# Patient Record
Sex: Female | Born: 1940
Health system: Southern US, Community
[De-identification: ages and names within clinical notes are randomized; demographics above are authoritative.]

## PROBLEM LIST (undated history)

## (undated) DIAGNOSIS — K219 Gastro-esophageal reflux disease without esophagitis: Secondary | ICD-10-CM

## (undated) DIAGNOSIS — T8859XA Other complications of anesthesia, initial encounter: Secondary | ICD-10-CM

## (undated) DIAGNOSIS — E785 Hyperlipidemia, unspecified: Secondary | ICD-10-CM

## (undated) DIAGNOSIS — H269 Unspecified cataract: Secondary | ICD-10-CM

## (undated) DIAGNOSIS — D72819 Decreased white blood cell count, unspecified: Secondary | ICD-10-CM

## (undated) DIAGNOSIS — M48061 Spinal stenosis, lumbar region without neurogenic claudication: Secondary | ICD-10-CM

## (undated) DIAGNOSIS — I10 Essential (primary) hypertension: Secondary | ICD-10-CM

## (undated) DIAGNOSIS — M1990 Unspecified osteoarthritis, unspecified site: Secondary | ICD-10-CM

## (undated) DIAGNOSIS — I341 Nonrheumatic mitral (valve) prolapse: Secondary | ICD-10-CM

## (undated) DIAGNOSIS — J40 Bronchitis, not specified as acute or chronic: Secondary | ICD-10-CM

## (undated) HISTORY — DX: Spinal stenosis, lumbar region without neurogenic claudication: M48.061

## (undated) HISTORY — DX: Unspecified osteoarthritis, unspecified site: M19.90

## (undated) HISTORY — PX: CATARACT EXTRACTION, BILATERAL: SHX1313

## (undated) HISTORY — DX: Hyperlipidemia, unspecified: E78.5

## (undated) HISTORY — PX: SHOULDER ARTHROSCOPY: SHX128

## (undated) HISTORY — DX: Gastro-esophageal reflux disease without esophagitis: K21.9

## (undated) HISTORY — PX: COLONOSCOPY: SHX174

## (undated) HISTORY — PX: BLEPHAROPLASTY: SUR158

## (undated) HISTORY — PX: SHOULDER ARTHROTOMY: SHX1050

## (undated) HISTORY — DX: Unspecified cataract: H26.9

---

## 2016-03-16 DIAGNOSIS — B029 Zoster without complications: Secondary | ICD-10-CM | POA: Diagnosis not present

## 2016-06-12 DIAGNOSIS — I10 Essential (primary) hypertension: Secondary | ICD-10-CM | POA: Diagnosis not present

## 2016-06-12 DIAGNOSIS — R3129 Other microscopic hematuria: Secondary | ICD-10-CM | POA: Diagnosis not present

## 2016-06-12 DIAGNOSIS — E119 Type 2 diabetes mellitus without complications: Secondary | ICD-10-CM | POA: Diagnosis not present

## 2016-06-12 DIAGNOSIS — R634 Abnormal weight loss: Secondary | ICD-10-CM | POA: Diagnosis not present

## 2016-06-12 DIAGNOSIS — E782 Mixed hyperlipidemia: Secondary | ICD-10-CM | POA: Diagnosis not present

## 2016-06-12 DIAGNOSIS — J302 Other seasonal allergic rhinitis: Secondary | ICD-10-CM | POA: Diagnosis not present

## 2016-06-12 DIAGNOSIS — E0590 Thyrotoxicosis, unspecified without thyrotoxic crisis or storm: Secondary | ICD-10-CM | POA: Diagnosis not present

## 2016-06-12 DIAGNOSIS — Z0001 Encounter for general adult medical examination with abnormal findings: Secondary | ICD-10-CM | POA: Diagnosis not present

## 2016-06-12 DIAGNOSIS — E559 Vitamin D deficiency, unspecified: Secondary | ICD-10-CM | POA: Diagnosis not present

## 2016-06-12 DIAGNOSIS — R5383 Other fatigue: Secondary | ICD-10-CM | POA: Diagnosis not present

## 2016-06-15 DIAGNOSIS — B351 Tinea unguium: Secondary | ICD-10-CM | POA: Diagnosis not present

## 2016-06-15 DIAGNOSIS — M79675 Pain in left toe(s): Secondary | ICD-10-CM | POA: Diagnosis not present

## 2016-06-15 DIAGNOSIS — M79674 Pain in right toe(s): Secondary | ICD-10-CM | POA: Diagnosis not present

## 2016-06-15 DIAGNOSIS — I872 Venous insufficiency (chronic) (peripheral): Secondary | ICD-10-CM | POA: Diagnosis not present

## 2016-06-29 DIAGNOSIS — I10 Essential (primary) hypertension: Secondary | ICD-10-CM | POA: Diagnosis not present

## 2016-07-03 DIAGNOSIS — J302 Other seasonal allergic rhinitis: Secondary | ICD-10-CM | POA: Diagnosis not present

## 2016-07-03 DIAGNOSIS — I10 Essential (primary) hypertension: Secondary | ICD-10-CM | POA: Diagnosis not present

## 2016-09-03 DIAGNOSIS — I10 Essential (primary) hypertension: Secondary | ICD-10-CM | POA: Diagnosis not present

## 2016-10-09 DIAGNOSIS — D72819 Decreased white blood cell count, unspecified: Secondary | ICD-10-CM | POA: Diagnosis not present

## 2016-10-09 DIAGNOSIS — M129 Arthropathy, unspecified: Secondary | ICD-10-CM | POA: Diagnosis not present

## 2016-10-09 DIAGNOSIS — I10 Essential (primary) hypertension: Secondary | ICD-10-CM | POA: Diagnosis not present

## 2016-11-13 DIAGNOSIS — M4647 Discitis, unspecified, lumbosacral region: Secondary | ICD-10-CM | POA: Diagnosis not present

## 2016-11-13 DIAGNOSIS — M129 Arthropathy, unspecified: Secondary | ICD-10-CM | POA: Diagnosis not present

## 2016-11-13 DIAGNOSIS — I10 Essential (primary) hypertension: Secondary | ICD-10-CM | POA: Diagnosis not present

## 2016-11-25 ENCOUNTER — Ambulatory Visit (HOSPITAL_COMMUNITY)
Admission: EM | Admit: 2016-11-25 | Discharge: 2016-11-25 | Disposition: A | Discharge status: Home / Self Care | Payer: Self-pay | Attending: Family Medicine | Admitting: Family Medicine

## 2016-11-25 ENCOUNTER — Encounter (HOSPITAL_COMMUNITY): Payer: Self-pay | Admitting: Emergency Medicine

## 2016-11-25 DIAGNOSIS — S40021S Contusion of right upper arm, sequela: Secondary | ICD-10-CM

## 2016-11-25 DIAGNOSIS — M7711 Lateral epicondylitis, right elbow: Secondary | ICD-10-CM

## 2016-11-25 DIAGNOSIS — S40021A Contusion of right upper arm, initial encounter: Secondary | ICD-10-CM

## 2016-11-25 DIAGNOSIS — M7918 Myalgia, other site: Secondary | ICD-10-CM

## 2016-11-25 HISTORY — DX: Essential (primary) hypertension: I10

## 2016-11-25 NOTE — Discharge Instructions (Signed)
At this point  may want to try a little heat off and on.For the right elbow use ice. Ibuprofen as needed. Limit wrist extension as demonstrated.
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
## 2016-12-17 ENCOUNTER — Telehealth: Payer: Self-pay | Admitting: Family Medicine

## 2016-12-17 NOTE — Telephone Encounter (Signed)
Patient called and left message on voice mail stating that she would like to schedule New Patient  appt with our office and since Dr. Moshe Cipro is not taking New Patient's she would schedule with Dr. Meda Coffee.  Please note this is a relative the [place].  Would Dr. Moshe Cipro consider taking patient on?  Please advise

## 2016-12-18 DIAGNOSIS — M4647 Discitis, unspecified, lumbosacral region: Secondary | ICD-10-CM | POA: Diagnosis not present

## 2016-12-18 DIAGNOSIS — I10 Essential (primary) hypertension: Secondary | ICD-10-CM | POA: Diagnosis not present

## 2016-12-18 DIAGNOSIS — J302 Other seasonal allergic rhinitis: Secondary | ICD-10-CM | POA: Diagnosis not present

## 2016-12-21 NOTE — Telephone Encounter (Signed)
I called patient to schedule an appointment, no answer I left a message.

## 2016-12-21 NOTE — Telephone Encounter (Signed)
Please schedule her with Dr Meda Coffee as I am currently closed

## 2017-01-12 DIAGNOSIS — M129 Arthropathy, unspecified: Secondary | ICD-10-CM | POA: Diagnosis not present

## 2017-01-12 DIAGNOSIS — I10 Essential (primary) hypertension: Secondary | ICD-10-CM | POA: Diagnosis not present

## 2017-01-12 DIAGNOSIS — D72819 Decreased white blood cell count, unspecified: Secondary | ICD-10-CM | POA: Diagnosis not present

## 2017-01-12 DIAGNOSIS — Z23 Encounter for immunization: Secondary | ICD-10-CM | POA: Diagnosis not present

## 2017-01-18 ENCOUNTER — Encounter: Payer: Self-pay | Admitting: Family Medicine

## 2017-01-18 ENCOUNTER — Ambulatory Visit (INDEPENDENT_AMBULATORY_CARE_PROVIDER_SITE_OTHER): Payer: Medicare Other | Admitting: Family Medicine

## 2017-01-18 ENCOUNTER — Other Ambulatory Visit: Payer: Self-pay

## 2017-01-18 VITALS — BP 128/76 | HR 56 | Temp 98.0°F | Resp 16 | Ht <= 58 in | Wt 137.0 lb

## 2017-01-18 DIAGNOSIS — K219 Gastro-esophageal reflux disease without esophagitis: Secondary | ICD-10-CM | POA: Diagnosis not present

## 2017-01-18 DIAGNOSIS — Z8619 Personal history of other infectious and parasitic diseases: Secondary | ICD-10-CM | POA: Diagnosis not present

## 2017-01-18 DIAGNOSIS — D72818 Other decreased white blood cell count: Secondary | ICD-10-CM

## 2017-01-18 DIAGNOSIS — Z9109 Other allergy status, other than to drugs and biological substances: Secondary | ICD-10-CM

## 2017-01-18 DIAGNOSIS — Z1239 Encounter for other screening for malignant neoplasm of breast: Secondary | ICD-10-CM

## 2017-01-18 DIAGNOSIS — Z1231 Encounter for screening mammogram for malignant neoplasm of breast: Secondary | ICD-10-CM

## 2017-01-18 DIAGNOSIS — E559 Vitamin D deficiency, unspecified: Secondary | ICD-10-CM

## 2017-01-18 DIAGNOSIS — D72819 Decreased white blood cell count, unspecified: Secondary | ICD-10-CM | POA: Diagnosis not present

## 2017-01-18 DIAGNOSIS — M8588 Other specified disorders of bone density and structure, other site: Secondary | ICD-10-CM

## 2017-01-18 DIAGNOSIS — I10 Essential (primary) hypertension: Secondary | ICD-10-CM | POA: Diagnosis not present

## 2017-01-18 DIAGNOSIS — E785 Hyperlipidemia, unspecified: Secondary | ICD-10-CM | POA: Insufficient documentation

## 2017-01-18 NOTE — Patient Instructions (Signed)
Need old records from prior medical offices  Exercise every day that you are able  Labs ordered today I will send you a letter with your test results.  If there is anything of concern, we will call right away.  Mammogram ordered today  See me in a month for a PE

## 2017-01-18 NOTE — Progress Notes (Signed)
Chief Complaint  Patient presents with  . Hypertension   Patient is here for her initial visit.  She moved to [place] a year ago.  Previously lived in [place] over 50 years. She is a retired Scientist, research (life sciences).  She considers herself in good health.  She eats well.  She exercises.  She denies any acute symptoms. She has long-standing hypertension.  Is well controlled on hydrochlorothiazide. Has a history of one abnormal EKG.  There is a question of an old MI.  She had a cardiology workup.  They told her her heart was "fine". She has a history of GERD.  She was H. pylori positive.  She was treated with Dexilant and antibiotics.  She is currently asymptomatic. She has a history of leukopenia.  She was sent to an oncologist.  They followed her over time.  They did 2 bone marrow biopsies.  They told her that they do not feel she has a myelodysplastic disorder. She has environmental allergies.  She takes over-the-counter medications. Has a history of osteopenia.  She prefers to treat this with dietary calcium and vitamin D.  She is not on a vitamin D supplement. She states her colonoscopy was in 2017. Mammogram is overdue. She no longer needs Pap smears. She states that she had a flu shot this year, has had a pneumonia vaccine.  She had her shingles shot.  Uncertain tetanus status.  Patient Active Problem List   Diagnosis Date Noted  . Essential hypertension 01/18/2017  . Chronic GERD 01/18/2017  . History of Helicobacter pylori infection 01/18/2017  . Mild hyperlipidemia 01/18/2017  . Chronic leukopenia 01/18/2017  . Environmental allergies 01/18/2017    Outpatient Encounter Medications as of 01/18/2017  Medication Sig  . hydrochlorothiazide (HYDRODIURIL) 25 MG tablet Take 25 mg by mouth daily.   No facility-administered encounter medications on file as of 01/18/2017.     Past Medical History:  Diagnosis Date  . Arthritis    hands, neck lower back  . Cataract    extracted  . GERD (gastroesophageal reflux disease)    prior med  . Hypertension   . Mild hyperlipidemia 01/18/2017    Past Surgical History:  Procedure Laterality Date  . CATARACT EXTRACTION, BILATERAL    . SHOULDER ARTHROSCOPY    . SHOULDER ARTHROTOMY Left    about 2007    Social History   Socioeconomic History  . Marital status: Married    Spouse name: Gwyndolyn Saxon  . Number of children: 3  . Years of education: Not on file  . Highest education level: Associate degree: academic program  Social Needs  . Financial resource strain: Not hard at all  . Food insecurity - worry: Never true  . Food insecurity - inability: Never true  . Transportation needs - medical: No  . Transportation needs - non-medical: No  Occupational History  . 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 Smoking status: Never Smoker  . Smokeless tobacco: Never Used  Substance and Sexual Activity  . Alcohol use: No  . Drug use: No  . Sexual activity: Not Currently  Other Topics Concern  . Not on file  Social History Narrative   Retired Therapist, sports   Lives with Husband Gwyndolyn Saxon   moved to [place] to be near children   Daughter is Esmond Harps    Family History  Problem Relation Age of Onset  . COPD Mother   . Arthritis Mother   .  Hearing loss Mother   . Hypertension Mother   . Stroke Father 55  . Diabetes Father   . Thyroid disease Daughter   . Drug abuse Son   . Cancer Brother        prostate  . Mental illness Brother        PTSD from war  . Heart disease Sister   . Hyperlipidemia Sister   . Hypertension Sister   . Cancer Sister        Leukemia  . Cancer Maternal Aunt        breast    Review of Systems  Constitutional: Negative for chills, fever and weight loss.  HENT: Negative for congestion and hearing loss.   Eyes: Negative for blurred vision and pain.  Respiratory: Negative for cough and shortness of breath.   Cardiovascular: Negative for chest pain and leg swelling.    Gastrointestinal: Negative for abdominal pain, constipation, diarrhea and heartburn.       No heartburn  Genitourinary: Negative for dysuria and frequency.  Musculoskeletal: Positive for back pain. Negative for falls, joint pain and myalgias.       Occasional back pain  Neurological: Negative for dizziness, seizures and headaches.  Psychiatric/Behavioral: Negative for depression. The patient is not nervous/anxious and does not have insomnia.     BP 128/76 (BP Location: Left Arm, Patient Position: Sitting, Cuff Size: Normal)   Pulse (!) 56   Temp 98 F (36.7 C) (Temporal)   Resp 16   Ht 4' 9.75" (1.467 m)   Wt 137 lb 0.6 oz (62.2 kg)   SpO2 97%   BMI 28.89 kg/m   Physical Exam  Constitutional: She is oriented to person, place, and time. She appears well-developed and well-nourished.  Pleasant and articulate.  HENT:  Head: Normocephalic and atraumatic.  Mouth/Throat: Oropharynx is clear and moist.  Eyes: Conjunctivae are normal. Pupils are equal, round, and reactive to light.  Neck: Normal range of motion.  Cardiovascular: Normal rate, regular rhythm and normal heart sounds.  Pulmonary/Chest: Effort normal and breath sounds normal.  Musculoskeletal: Normal range of motion. She exhibits no edema.  Neurological: She is alert and oriented to person, place, and time.  Psychiatric: She has a normal mood and affect. Her behavior is normal. Thought content normal.   ASSESSMENT/PLAN:  1. Essential hypertension Controlled - CBC - COMPLETE METABOLIC PANEL WITH GFR - Lipid panel - Urinalysis, Routine w reflex microscopic  2. Osteopenia of other site By history - VITAMIN D 25 Hydroxy (Vit-D Deficiency, Fractures)  3. Chronic GERD Currently asymptomatic  4. Screening for breast cancer  Due for mammogram.  Past mammograms negative - MM Digital Screening; Future  5. Vitamin D deficiency By history - VITAMIN D 25 Hydroxy (Vit-D Deficiency, Fractures)  6. History of  Helicobacter pylori infection Remote history.  Currently asymptomatic. Mild hyperlipidemia Is on omega oil  7. Chronic leukopenia By history  8. Environmental allergies By history   Patient Instructions  Need old records from prior medical offices  Exercise every day that you are able  Labs ordered today I will send you a letter with your test results.  If there is anything of concern, we will call right away.  Mammogram ordered today  See me in a month for a PE   Raylene Everts, MD

## 2017-01-19 LAB — CBC
HCT: 37.8 % (ref 35.0–45.0)
Hemoglobin: 12.9 g/dL (ref 11.7–15.5)
MCH: 29.5 pg (ref 27.0–33.0)
MCHC: 34.1 g/dL (ref 32.0–36.0)
MCV: 86.5 fL (ref 80.0–100.0)
MPV: 11 fL (ref 7.5–12.5)
PLATELETS: 166 10*3/uL (ref 140–400)
RBC: 4.37 10*6/uL (ref 3.80–5.10)
RDW: 12.2 % (ref 11.0–15.0)
WBC: 2.6 10*3/uL — AB (ref 3.8–10.8)

## 2017-01-19 LAB — LIPID PANEL
CHOL/HDL RATIO: 1.5 (calc) (ref <5.0)
CHOLESTEROL: 186 mg/dL (ref <200)
HDL: 128 mg/dL (ref >50)
LDL CHOLESTEROL (CALC): 44 mg/dL
NON-HDL CHOLESTEROL (CALC): 58 mg/dL (ref <130)
TRIGLYCERIDES: 49 mg/dL (ref <150)

## 2017-01-19 LAB — COMPLETE METABOLIC PANEL WITH GFR
AG Ratio: 1.5 (calc) (ref 1.0–2.5)
ALKALINE PHOSPHATASE (APISO): 37 U/L (ref 33–130)
ALT: 20 U/L (ref 6–29)
AST: 24 U/L (ref 10–35)
Albumin: 4.3 g/dL (ref 3.6–5.1)
BUN: 17 mg/dL (ref 7–25)
CALCIUM: 9.9 mg/dL (ref 8.6–10.4)
CO2: 31 mmol/L (ref 20–32)
CREATININE: 0.88 mg/dL (ref 0.60–0.93)
Chloride: 102 mmol/L (ref 98–110)
GFR, EST NON AFRICAN AMERICAN: 64 mL/min/{1.73_m2} (ref >=60)
GFR, Est African American: 74 mL/min/{1.73_m2} (ref >=60)
Globulin: 2.8 g/dL (calc) (ref 1.9–3.7)
Glucose, Bld: 84 mg/dL (ref 65–99)
Potassium: 4 mmol/L (ref 3.5–5.3)
Sodium: 140 mmol/L (ref 135–146)
Total Bilirubin: 1.3 mg/dL — ABNORMAL HIGH (ref 0.2–1.2)
Total Protein: 7.1 g/dL (ref 6.1–8.1)

## 2017-01-19 LAB — URINALYSIS, ROUTINE W REFLEX MICROSCOPIC
BILIRUBIN URINE: NEGATIVE
Glucose, UA: NEGATIVE
Hgb urine dipstick: NEGATIVE
KETONES UR: NEGATIVE
Leukocytes, UA: NEGATIVE
NITRITE: NEGATIVE
PROTEIN: NEGATIVE
Specific Gravity, Urine: 1.01 (ref 1.001–1.03)
pH: 6.5 (ref 5.0–8.0)

## 2017-01-19 LAB — VITAMIN D 25 HYDROXY (VIT D DEFICIENCY, FRACTURES): VIT D 25 HYDROXY: 35 ng/mL (ref 30–100)

## 2017-01-26 ENCOUNTER — Encounter: Payer: Self-pay | Admitting: Family Medicine

## 2017-02-04 DIAGNOSIS — J302 Other seasonal allergic rhinitis: Secondary | ICD-10-CM | POA: Diagnosis not present

## 2017-02-04 DIAGNOSIS — M129 Arthropathy, unspecified: Secondary | ICD-10-CM | POA: Diagnosis not present

## 2017-02-04 DIAGNOSIS — I10 Essential (primary) hypertension: Secondary | ICD-10-CM | POA: Diagnosis not present

## 2017-02-17 ENCOUNTER — Other Ambulatory Visit: Payer: Self-pay | Admitting: Family Medicine

## 2017-02-17 DIAGNOSIS — Z1231 Encounter for screening mammogram for malignant neoplasm of breast: Secondary | ICD-10-CM

## 2017-02-18 ENCOUNTER — Encounter: Payer: Self-pay | Admitting: Family Medicine

## 2017-02-18 ENCOUNTER — Other Ambulatory Visit: Payer: Self-pay

## 2017-02-18 ENCOUNTER — Ambulatory Visit (INDEPENDENT_AMBULATORY_CARE_PROVIDER_SITE_OTHER): Payer: Medicare Other | Admitting: Family Medicine

## 2017-02-18 VITALS — BP 120/74 | HR 60 | Temp 97.8°F | Resp 16 | Ht <= 58 in | Wt 143.1 lb

## 2017-02-18 DIAGNOSIS — I10 Essential (primary) hypertension: Secondary | ICD-10-CM | POA: Diagnosis not present

## 2017-02-18 DIAGNOSIS — H9193 Unspecified hearing loss, bilateral: Secondary | ICD-10-CM

## 2017-02-18 DIAGNOSIS — Z0000 Encounter for general adult medical examination without abnormal findings: Secondary | ICD-10-CM | POA: Diagnosis not present

## 2017-02-18 DIAGNOSIS — Z8679 Personal history of other diseases of the circulatory system: Secondary | ICD-10-CM | POA: Diagnosis not present

## 2017-02-18 DIAGNOSIS — D72819 Decreased white blood cell count, unspecified: Secondary | ICD-10-CM | POA: Diagnosis not present

## 2017-02-18 NOTE — Progress Notes (Signed)
Chief Complaint  Patient presents with  . Annual Exam  Patient presents today for physical examination She is in good health and compliant with her blood pressure medication.  Her blood pressure is well controlled She requests a referral to ENT for her hearing loss, (upon suggestion from her husband). She previously was under the care of him at otology for her leukopenia.  This is stable on recent lab testing.  She does not feel she needs to go to hematology at this time We did discuss her recent laboratory results.  She has mild hyperlipidemia.  She had mild leukopenia.  Everything else was normal as expected. Her mammogram is scheduled for later this month. Her DEXA scan was in 2017. Her colonoscopy was in 2017. I do not have records but her immunizations are up-to-date including tetanus, shingles, pneumonia, and flu.  These records are requested she tries to eat well.  Her weight is stable.  She does not get regular exercise.  This is recommended for her. She states this time year she does have a fair amount of allergy symptoms, postnasal drip, and hoarseness.  She chooses not to take medication.  She understands that over-the-counter medicine such as Claritin or Zyrtec, Flonase or Nasonex may help with her symptoms if she chooses She is overdue for an eye exam and the suggestion She does not require dental evaluation since she has dentures that fit well   Patient Active Problem List   Diagnosis Date Noted  . H/O mitral valve prolapse 02/18/2017  . Essential hypertension 01/18/2017  . Chronic GERD 01/18/2017  . History of Helicobacter pylori infection 01/18/2017  . Mild hyperlipidemia 01/18/2017  . Chronic leukopenia 01/18/2017  . Environmental allergies 01/18/2017    Outpatient Encounter Medications as of 02/18/2017  Medication Sig  . hydrochlorothiazide (HYDRODIURIL) 25 MG tablet Take 25 mg by mouth daily.   No facility-administered encounter medications on file as of  02/18/2017.     No Known Allergies  Review of Systems  Constitutional: Negative for activity change, appetite change and unexpected weight change.  HENT: Positive for hearing loss, postnasal drip and voice change. Negative for congestion, dental problem and rhinorrhea.   Eyes: Negative for redness and visual disturbance.  Respiratory: Negative for cough and shortness of breath.   Cardiovascular: Negative for chest pain, palpitations and leg swelling.  Gastrointestinal: Negative for abdominal pain, constipation and diarrhea.  Genitourinary: Negative for difficulty urinating, frequency, menstrual problem and vaginal bleeding.  Musculoskeletal: Negative for arthralgias and back pain.  Neurological: Negative for dizziness and headaches.  Psychiatric/Behavioral: Negative for confusion, dysphoric mood and sleep disturbance. The patient is not nervous/anxious.     BP 120/74 (BP Location: Left Arm, Patient Position: Sitting, Cuff Size: Normal)   Pulse 60   Temp 97.8 F (36.6 C) (Temporal)   Resp 16   Ht 4\' 10"  (1.473 m)   Wt 143 lb 1.3 oz (64.9 kg)   SpO2 99%   BMI 29.90 kg/m   Physical Exam  BP 120/74 (BP Location: Left Arm, Patient Position: Sitting, Cuff Size: Normal)   Pulse 60   Temp 97.8 F (36.6 C) (Temporal)   Resp 16   Ht 4\' 10"  (1.473 m)   Wt 143 lb 1.3 oz (64.9 kg)   SpO2 99%   BMI 29.90 kg/m   General Appearance:    Alert, cooperative, no distress, appears stated age  Head:    Normocephalic, without obvious abnormality, atraumatic  Eyes:    PERRL, conjunctiva/corneas  clear, EOM's intact, fundi    benign, both eyes.  Status post bilateral cataract surgery with mild cloudiness  Ears:    Normal TM's and external ear canals, both ears  Nose:   Nares normal, septum midline, mucosa normal, no drainage    or sinus tenderness  Throat:   Lips, mucosa, and tongue normal,  and gums normal  Neck:   Supple, symmetrical, trachea midline, no adenopathy;    thyroid:  no  enlargement/tenderness/nodules; no carotid   bruit   Back:     Symmetric, no curvature, ROM normal, no CVA tenderness  Lungs:     Clear to auscultation bilaterally, respirations unlabored  Chest Wall:    No tenderness or deformity   Heart:    Regular rate and rhythm, S1 and S2 normal, no murmur, rub   or gallop  Breast    d eferred per patient  Abdomen:     Soft, non-tender, bowel sounds active all four quadrants,    no masses, no organomegaly  Extremities:   Extremities normal, atraumatic, no cyanosis or edema  Pulses:   2+ and symmetric all extremities  Skin:   Skin color, texture, turgor normal, no rashes or lesions  Lymph nodes:   Cervical, supraclavicular, and axillary nodes normal  Neurologic:   Normal strength, sensation and brisk reflexes    throughout     ASSESSMENT/PLAN:  1. Annual physical exam No unexpected findings  2. H/O mitral valve prolapse No murmur auscultated  3. Bilateral hearing loss, unspecified hearing loss type Referred for hearing evaluation - Ambulatory referral to ENT 4.  Chronic leukopenia stable  5 essential hypertension well-controlled  Patient Instructions  Call for illness or if you desire referrals (hematology or cardiology) Due for Eye exam Continue to eat well and try to get more exercise  See me yearly for physical   Raylene Everts, MD

## 2017-02-18 NOTE — Patient Instructions (Signed)
Call for illness or if you desire referrals (hematology or cardiology) Due for Eye exam Continue to eat well and try to get more exercise  See me yearly for physical

## 2017-02-22 ENCOUNTER — Ambulatory Visit (HOSPITAL_COMMUNITY)
Admission: RE | Admit: 2017-02-22 | Discharge: 2017-02-22 | Disposition: A | Discharge status: Home / Self Care | Payer: Medicare Other | Source: Ambulatory Visit | Attending: Family Medicine | Admitting: Family Medicine

## 2017-02-22 DIAGNOSIS — Z1231 Encounter for screening mammogram for malignant neoplasm of breast: Secondary | ICD-10-CM | POA: Diagnosis not present

## 2017-02-24 ENCOUNTER — Other Ambulatory Visit: Payer: Self-pay | Admitting: Family Medicine

## 2017-02-24 DIAGNOSIS — R928 Other abnormal and inconclusive findings on diagnostic imaging of breast: Secondary | ICD-10-CM

## 2017-03-08 ENCOUNTER — Telehealth: Payer: Self-pay

## 2017-03-08 DIAGNOSIS — R928 Other abnormal and inconclusive findings on diagnostic imaging of breast: Secondary | ICD-10-CM

## 2017-03-08 NOTE — Telephone Encounter (Signed)
mammo and [place] orders pended.

## 2017-03-16 ENCOUNTER — Ambulatory Visit (HOSPITAL_COMMUNITY)
Admission: RE | Admit: 2017-03-16 | Discharge: 2017-03-16 | Disposition: A | Discharge status: Home / Self Care | Payer: Medicare Other | Source: Ambulatory Visit | Attending: Family Medicine | Admitting: Family Medicine

## 2017-03-16 DIAGNOSIS — R922 Inconclusive mammogram: Secondary | ICD-10-CM | POA: Diagnosis not present

## 2017-03-16 DIAGNOSIS — R928 Other abnormal and inconclusive findings on diagnostic imaging of breast: Secondary | ICD-10-CM | POA: Insufficient documentation

## 2017-03-16 DIAGNOSIS — N6489 Other specified disorders of breast: Secondary | ICD-10-CM | POA: Diagnosis not present

## 2017-04-22 ENCOUNTER — Ambulatory Visit (INDEPENDENT_AMBULATORY_CARE_PROVIDER_SITE_OTHER): Payer: Medicare Other | Admitting: Otolaryngology

## 2017-04-22 DIAGNOSIS — H903 Sensorineural hearing loss, bilateral: Secondary | ICD-10-CM | POA: Diagnosis not present

## 2017-05-03 ENCOUNTER — Encounter: Payer: Self-pay | Admitting: Family Medicine

## 2017-05-04 ENCOUNTER — Encounter: Payer: Self-pay | Admitting: Family Medicine

## 2017-05-04 DIAGNOSIS — R922 Inconclusive mammogram: Secondary | ICD-10-CM | POA: Insufficient documentation

## 2017-05-22 LAB — POCT GLYCOSYLATED HEMOGLOBIN (HGB A1C)

## 2017-06-23 DIAGNOSIS — H52203 Unspecified astigmatism, bilateral: Secondary | ICD-10-CM | POA: Diagnosis not present

## 2017-06-23 DIAGNOSIS — Z961 Presence of intraocular lens: Secondary | ICD-10-CM | POA: Diagnosis not present

## 2017-10-05 ENCOUNTER — Telehealth: Payer: Self-pay | Admitting: Family Medicine

## 2017-10-05 NOTE — Telephone Encounter (Signed)
Mrs Esmond Harps, is calling to see if you will see her Mother Wendy [name], as she was a former Designer, industrial/product patient.

## 2017-10-19 NOTE — Telephone Encounter (Signed)
Please explain to   Caller  that  I am still  unable to accept new patients at this time

## 2017-10-20 NOTE — Telephone Encounter (Signed)
I called patient and LMOM also called Esmond Harps to let her know we could not accept any new patients at this time.  I told her we are hoping to open a new clinic downstairs in spring 2020.

## 2017-11-15 ENCOUNTER — Ambulatory Visit (HOSPITAL_COMMUNITY)
Admission: EM | Admit: 2017-11-15 | Discharge: 2017-11-15 | Disposition: A | Discharge status: Home / Self Care | Payer: Medicare Other | Attending: Family Medicine | Admitting: Family Medicine

## 2017-11-15 ENCOUNTER — Encounter (HOSPITAL_COMMUNITY): Payer: Self-pay | Admitting: Family Medicine

## 2017-11-15 DIAGNOSIS — B9789 Other viral agents as the cause of diseases classified elsewhere: Secondary | ICD-10-CM

## 2017-11-15 DIAGNOSIS — H60542 Acute eczematoid otitis externa, left ear: Secondary | ICD-10-CM

## 2017-11-15 DIAGNOSIS — J069 Acute upper respiratory infection, unspecified: Secondary | ICD-10-CM | POA: Diagnosis not present

## 2017-11-15 MED ORDER — NEOMYCIN-POLYMYXIN-HC 3.5-10000-1 OT SUSP: 4.0000 [drp] | Freq: Every day | OTIC | 1 refills | Status: DC | PRN

## 2017-11-15 NOTE — ED Provider Notes (Signed)
[facility]    CSN: [id number] Arrival date & time: 11/15/17  1430     History   Chief Complaint Chief Complaint  Patient presents with  . URI    HPI Wendy [name] is a 77 y.o. female.   77 year old woman comes in for evaluation of upper respiratory type symptoms.  Her past medical history is significant for hypertension, past history of H. pylori, and chronic leukopenia.  Patient has had a cough, sore throat and some congestion for the last 3 or 4 days.  She has had no fever.  The cough is productive occasionally with clear phlegm.  She has had some left chest pain which she was wanting to make sure was not a walking pneumonia.  Patient has a separate problem which is itching in her left ear canal periodically.     Past Medical History:  Diagnosis Date  . Arthritis    hands, neck lower back  . Cataract    extracted  . GERD (gastroesophageal reflux disease)    prior med  . Hypertension   . Mild hyperlipidemia 01/18/2017    Patient Active Problem List   Diagnosis Date Noted  . Dense breast tissue on mammogram 05/04/2017  . H/O mitral valve prolapse 02/18/2017  . Essential hypertension 01/18/2017  . Chronic GERD 01/18/2017  . History of Helicobacter pylori infection 01/18/2017  . Mild hyperlipidemia 01/18/2017  . Chronic leukopenia 01/18/2017  . Environmental allergies 01/18/2017    Past Surgical History:  Procedure Laterality Date  . CATARACT EXTRACTION, BILATERAL    . SHOULDER ARTHROSCOPY    . SHOULDER ARTHROTOMY Left    about 2007    OB History   None      Home Medications    Prior to Admission medications   Medication Sig Start Date End Date Taking? Authorizing Provider  hydrochlorothiazide (HYDRODIURIL) 25 MG tablet Take 25 mg by mouth daily.    [provider]  neomycin-polymyxin-hydrocortisone (CORTISPORIN) 3.5-10000-1 OTIC suspension Place 4 drops into the left ear daily as needed. 11/15/17   Robyn Haber, MD     Family History Family History  Problem Relation Age of Onset  . COPD Mother   . Arthritis Mother   . Hearing loss Mother   . Hypertension Mother   . Stroke Father 19  . Diabetes Father   . Thyroid disease Daughter   . Drug abuse Son   . Cancer Brother        prostate  . Mental illness Brother        PTSD from war  . Heart disease Sister   . Hyperlipidemia Sister   . Hypertension Sister   . Cancer Sister        Leukemia  . Cancer Maternal Aunt        breast    Social History Social History   Tobacco Use  . Smoking status: Never Smoker  . Smokeless tobacco: Never Used  Substance Use Topics  . Alcohol use: No  . Drug use: No     Allergies   Patient has no known allergies.   Review of Systems Review of Systems  Constitutional: Negative.   HENT: Positive for congestion and sore throat.   Respiratory: Positive for cough.   All other systems reviewed and are negative.    Physical Exam Triage Vital Signs ED Triage Vitals [11/15/17 1515]  Enc Vitals Group     BP (!) 145/96     Pulse Rate (!) 56  Resp 18     Temp 97.9 F (36.6 C)     Temp Source Oral     SpO2 99 %     Weight      Height      Head Circumference      Peak Flow      Pain Score      Pain Loc      Pain Edu?      Excl. in [place]?    No data found.  Updated Vital Signs BP (!) 146/85 (BP Location: Right Arm)   Pulse 82   Temp 98.5 F (36.9 C) (Oral)   Resp 16   SpO2 100%   Physical Exam  Constitutional: She is oriented to person, place, and time. She appears well-developed and well-nourished.  HENT:  Right Ear: External ear normal.  Mouth/Throat: Oropharynx is clear and moist.  Few exudates in the left ear canal  Eyes: Pupils are equal, round, and reactive to light. Conjunctivae are normal.  Neck: Normal range of motion. Neck supple.  Cardiovascular: Normal rate, regular rhythm and normal heart sounds.  Pulmonary/Chest: Effort normal and breath sounds normal. She exhibits no  tenderness.  Musculoskeletal: Normal range of motion.  Neurological: She is alert and oriented to person, place, and time.  Skin: Skin is warm and dry.  Nursing note and vitals reviewed.    UC Treatments / Results  Labs (all labs ordered are listed, but only abnormal results are displayed) Labs Reviewed - No data to display  EKG None  Radiology No results found.  Procedures Procedures (including critical care time)  Medications Ordered in UC Medications - No data to display  Initial Impression / Assessment and Plan / UC Course  I have reviewed the triage vital signs and the nursing notes.  Pertinent labs & imaging results that were available during my care of the patient were reviewed by me and considered in my medical decision making (see chart for details).    Final Clinical Impressions(s) / UC Diagnoses   Final diagnoses:  Viral URI with cough  Eczema of left external ear   Discharge Instructions   None    ED Prescriptions    Medication Sig Dispense Auth. Provider   neomycin-polymyxin-hydrocortisone (CORTISPORIN) 3.5-10000-1 OTIC suspension Place 4 drops into the left ear daily as needed. 10 mL Robyn Haber, MD     Controlled Substance Prescriptions [place] Controlled Substance Registry consulted? Not Applicable   Robyn Haber, MD 11/15/17 1546

## 2017-11-15 NOTE — ED Triage Notes (Signed)
Provider triage  

## 2017-12-21 DIAGNOSIS — M8589 Other specified disorders of bone density and structure, multiple sites: Secondary | ICD-10-CM | POA: Diagnosis not present

## 2017-12-21 DIAGNOSIS — I341 Nonrheumatic mitral (valve) prolapse: Secondary | ICD-10-CM | POA: Diagnosis not present

## 2017-12-21 DIAGNOSIS — Z13220 Encounter for screening for lipoid disorders: Secondary | ICD-10-CM | POA: Diagnosis not present

## 2017-12-21 DIAGNOSIS — I10 Essential (primary) hypertension: Secondary | ICD-10-CM | POA: Diagnosis not present

## 2017-12-21 DIAGNOSIS — Z8719 Personal history of other diseases of the digestive system: Secondary | ICD-10-CM | POA: Diagnosis not present

## 2017-12-21 DIAGNOSIS — D72819 Decreased white blood cell count, unspecified: Secondary | ICD-10-CM | POA: Diagnosis not present

## 2017-12-21 DIAGNOSIS — Z01419 Encounter for gynecological examination (general) (routine) without abnormal findings: Secondary | ICD-10-CM | POA: Diagnosis not present

## 2017-12-22 DIAGNOSIS — Z01419 Encounter for gynecological examination (general) (routine) without abnormal findings: Secondary | ICD-10-CM | POA: Diagnosis not present

## 2017-12-22 DIAGNOSIS — I341 Nonrheumatic mitral (valve) prolapse: Secondary | ICD-10-CM | POA: Diagnosis not present

## 2017-12-22 DIAGNOSIS — M8589 Other specified disorders of bone density and structure, multiple sites: Secondary | ICD-10-CM | POA: Diagnosis not present

## 2017-12-22 DIAGNOSIS — Z8719 Personal history of other diseases of the digestive system: Secondary | ICD-10-CM | POA: Diagnosis not present

## 2017-12-22 DIAGNOSIS — Z136 Encounter for screening for cardiovascular disorders: Secondary | ICD-10-CM | POA: Diagnosis not present

## 2017-12-22 DIAGNOSIS — D72819 Decreased white blood cell count, unspecified: Secondary | ICD-10-CM | POA: Diagnosis not present

## 2017-12-22 DIAGNOSIS — M810 Age-related osteoporosis without current pathological fracture: Secondary | ICD-10-CM | POA: Diagnosis not present

## 2017-12-22 DIAGNOSIS — I10 Essential (primary) hypertension: Secondary | ICD-10-CM | POA: Diagnosis not present

## 2017-12-31 DIAGNOSIS — Z23 Encounter for immunization: Secondary | ICD-10-CM | POA: Diagnosis not present

## 2018-02-17 DIAGNOSIS — Z01419 Encounter for gynecological examination (general) (routine) without abnormal findings: Secondary | ICD-10-CM | POA: Diagnosis not present

## 2018-02-17 DIAGNOSIS — Z8739 Personal history of other diseases of the musculoskeletal system and connective tissue: Secondary | ICD-10-CM | POA: Diagnosis not present

## 2018-02-17 DIAGNOSIS — R922 Inconclusive mammogram: Secondary | ICD-10-CM | POA: Diagnosis not present

## 2018-02-18 ENCOUNTER — Other Ambulatory Visit: Payer: Self-pay | Admitting: Nurse Practitioner

## 2018-02-18 DIAGNOSIS — M8580 Other specified disorders of bone density and structure, unspecified site: Secondary | ICD-10-CM

## 2018-03-02 DIAGNOSIS — H2513 Age-related nuclear cataract, bilateral: Secondary | ICD-10-CM | POA: Diagnosis not present

## 2018-03-11 ENCOUNTER — Other Ambulatory Visit (HOSPITAL_COMMUNITY): Payer: Self-pay | Admitting: Obstetrics and Gynecology

## 2018-03-11 DIAGNOSIS — Z1231 Encounter for screening mammogram for malignant neoplasm of breast: Secondary | ICD-10-CM

## 2018-03-17 ENCOUNTER — Encounter (HOSPITAL_COMMUNITY): Payer: Self-pay

## 2018-03-17 ENCOUNTER — Ambulatory Visit (HOSPITAL_COMMUNITY)
Admission: RE | Admit: 2018-03-17 | Discharge: 2018-03-17 | Disposition: A | Discharge status: Home / Self Care | Payer: Medicare Other | Source: Ambulatory Visit | Attending: Obstetrics and Gynecology | Admitting: Obstetrics and Gynecology

## 2018-03-17 DIAGNOSIS — Z1231 Encounter for screening mammogram for malignant neoplasm of breast: Secondary | ICD-10-CM | POA: Insufficient documentation

## 2018-03-23 ENCOUNTER — Ambulatory Visit
Admission: RE | Admit: 2018-03-23 | Discharge: 2018-03-23 | Disposition: A | Discharge status: Home / Self Care | Payer: Medicare Other | Source: Ambulatory Visit | Attending: Nurse Practitioner | Admitting: Nurse Practitioner

## 2018-03-23 DIAGNOSIS — M8580 Other specified disorders of bone density and structure, unspecified site: Secondary | ICD-10-CM

## 2018-03-23 DIAGNOSIS — Z780 Asymptomatic menopausal state: Secondary | ICD-10-CM | POA: Diagnosis not present

## 2018-03-23 DIAGNOSIS — Z13820 Encounter for screening for osteoporosis: Secondary | ICD-10-CM | POA: Diagnosis not present

## 2018-04-13 DIAGNOSIS — J069 Acute upper respiratory infection, unspecified: Secondary | ICD-10-CM | POA: Diagnosis not present

## 2018-04-25 ENCOUNTER — Ambulatory Visit (INDEPENDENT_AMBULATORY_CARE_PROVIDER_SITE_OTHER): Payer: Medicare Other | Admitting: Otolaryngology

## 2018-04-26 DIAGNOSIS — M8589 Other specified disorders of bone density and structure, multiple sites: Secondary | ICD-10-CM | POA: Diagnosis not present

## 2018-04-26 DIAGNOSIS — I10 Essential (primary) hypertension: Secondary | ICD-10-CM | POA: Diagnosis not present

## 2018-04-26 DIAGNOSIS — Z8719 Personal history of other diseases of the digestive system: Secondary | ICD-10-CM | POA: Diagnosis not present

## 2018-04-26 DIAGNOSIS — I341 Nonrheumatic mitral (valve) prolapse: Secondary | ICD-10-CM | POA: Diagnosis not present

## 2018-04-26 DIAGNOSIS — D72819 Decreased white blood cell count, unspecified: Secondary | ICD-10-CM | POA: Diagnosis not present

## 2018-04-26 DIAGNOSIS — Z13220 Encounter for screening for lipoid disorders: Secondary | ICD-10-CM | POA: Diagnosis not present

## 2018-04-28 ENCOUNTER — Ambulatory Visit (INDEPENDENT_AMBULATORY_CARE_PROVIDER_SITE_OTHER): Payer: Medicare Other | Admitting: Otolaryngology

## 2018-04-28 DIAGNOSIS — H903 Sensorineural hearing loss, bilateral: Secondary | ICD-10-CM | POA: Diagnosis not present

## 2018-04-28 DIAGNOSIS — R05 Cough: Secondary | ICD-10-CM

## 2018-08-30 DIAGNOSIS — M8589 Other specified disorders of bone density and structure, multiple sites: Secondary | ICD-10-CM | POA: Diagnosis not present

## 2018-08-30 DIAGNOSIS — D72819 Decreased white blood cell count, unspecified: Secondary | ICD-10-CM | POA: Diagnosis not present

## 2018-08-30 DIAGNOSIS — I10 Essential (primary) hypertension: Secondary | ICD-10-CM | POA: Diagnosis not present

## 2018-09-06 DIAGNOSIS — D72819 Decreased white blood cell count, unspecified: Secondary | ICD-10-CM | POA: Diagnosis not present

## 2018-09-06 DIAGNOSIS — I10 Essential (primary) hypertension: Secondary | ICD-10-CM | POA: Diagnosis not present

## 2018-09-06 DIAGNOSIS — M8589 Other specified disorders of bone density and structure, multiple sites: Secondary | ICD-10-CM | POA: Diagnosis not present

## 2018-09-06 DIAGNOSIS — H02409 Unspecified ptosis of unspecified eyelid: Secondary | ICD-10-CM | POA: Diagnosis not present

## 2018-09-15 DIAGNOSIS — H02401 Unspecified ptosis of right eyelid: Secondary | ICD-10-CM | POA: Diagnosis not present

## 2018-09-15 DIAGNOSIS — M8589 Other specified disorders of bone density and structure, multiple sites: Secondary | ICD-10-CM | POA: Diagnosis not present

## 2018-09-15 DIAGNOSIS — R51 Headache: Secondary | ICD-10-CM | POA: Diagnosis not present

## 2018-09-15 DIAGNOSIS — I10 Essential (primary) hypertension: Secondary | ICD-10-CM | POA: Diagnosis not present

## 2018-09-15 DIAGNOSIS — D72819 Decreased white blood cell count, unspecified: Secondary | ICD-10-CM | POA: Diagnosis not present

## 2018-09-18 ENCOUNTER — Other Ambulatory Visit: Payer: Self-pay | Admitting: *Deleted

## 2018-09-18 DIAGNOSIS — Z20822 Contact with and (suspected) exposure to covid-19: Secondary | ICD-10-CM

## 2018-09-20 ENCOUNTER — Encounter: Payer: Self-pay | Admitting: Neurology

## 2018-09-20 ENCOUNTER — Telehealth: Payer: Self-pay | Admitting: Neurology

## 2018-09-20 ENCOUNTER — Other Ambulatory Visit: Payer: Self-pay

## 2018-09-20 ENCOUNTER — Ambulatory Visit (INDEPENDENT_AMBULATORY_CARE_PROVIDER_SITE_OTHER): Payer: Medicare Other | Admitting: Neurology

## 2018-09-20 VITALS — BP 130/68 | HR 57 | Temp 98.0°F | Ht 59.0 in | Wt 144.6 lb

## 2018-09-20 DIAGNOSIS — R51 Headache: Secondary | ICD-10-CM | POA: Diagnosis not present

## 2018-09-20 DIAGNOSIS — E0500 Thyrotoxicosis with diffuse goiter without thyrotoxic crisis or storm: Secondary | ICD-10-CM

## 2018-09-20 DIAGNOSIS — H02401 Unspecified ptosis of right eyelid: Secondary | ICD-10-CM

## 2018-09-20 DIAGNOSIS — R519 Headache, unspecified: Secondary | ICD-10-CM

## 2018-09-20 DIAGNOSIS — H5461 Unqualified visual loss, right eye, normal vision left eye: Secondary | ICD-10-CM

## 2018-09-20 DIAGNOSIS — H5789 Other specified disorders of eye and adnexa: Secondary | ICD-10-CM

## 2018-09-20 DIAGNOSIS — I671 Cerebral aneurysm, nonruptured: Secondary | ICD-10-CM | POA: Diagnosis not present

## 2018-09-20 MED ORDER — PREDNISONE 20 MG PO TABS: 60.0000 mg | ORAL_TABLET | Freq: Every day | ORAL | 0 refills | Status: DC

## 2018-09-20 NOTE — Patient Instructions (Addendum)
Start prednisone today MRI of the brain and blood vessels Labwork    Temporal Arteritis  Temporal arteritis is a condition that causes arteries to become inflamed. It usually affects arteries in your head and face, but arteries in any part of the body can become inflamed. The condition is also called giant cell arteritis.  Temporal arteritis can cause serious problems such as blindness. Early treatment can help prevent these problems. What are the causes? The cause of this condition is not known. What increases the risk? The following factors may make you more likely to develop this condition:  Being older than 50.  Being a woman.  Being Caucasian.  Being of [place], [place], [place], [place] or [place] ancestry.  Having a family history of the condition.  Having a certain condition that causes muscle pain and stiffness (polymyalgia rheumatica, PMR). What are the signs or symptoms? Some people with temporal arteritis have just one symptom, while others have several symptoms. Most symptoms are related to the head and face. These may include:  Headache.  Hard or swollen temples. This is common. Your temples are the areas on either side of your forehead. If your temples are swollen, it may hurt to touch them.  Pain when combing your hair or when laying your head down.  Pain in the jaw when chewing.  Pain in the throat or tongue.  Problems with your vision, such as sudden loss of vision in one eye, or seeing double. Other symptoms may include:  Fever.  Tiredness (fatigue).  A dry cough.  Pain in the hips and shoulders.  Pain in the arms during exercise.  Depression.  Weight loss. How is this diagnosed? This condition may be diagnosed based on:  Your symptoms.  Your medical history.  A physical exam.  Tests, including: ? Blood tests. ? A test in which a tissue sample is removed from an artery so it can be examined (biopsy). ? Imaging tests, such as an  ultrasound or MRI. How is this treated? This condition may be treated with:  A type of medicine to reduce inflammation (corticosteroid).  Medicines to weaken your immune system (immunosuppressants).  Other medicines to treat vision problems. You will need to see your health care provider while you are being treated. The medicines used to treat this condition can increase your risk of problems such as bone loss and diabetes. During follow-up visits, your health care provider will check for problems by:  Doing blood tests and bone density tests.  Checking your blood pressure and blood sugar. Follow these instructions at home: Medicines  Take over-the-counter and prescription medicines only as told by your health care provider.  Take any vitamins or supplements recommended by your health care provider. These may include vitamin D and calcium, which help keep your bones from becoming weak. Eating and drinking   Eat a heart-healthy diet. This may include: ? Eating high-fiber foods, such as fresh fruits and vegetables, whole grains, and beans. ? Eating heart-healthy fats (omega-3 fats), such as fish, flaxseed, and flaxseed oil. ? Limiting foods that are high in saturated fat and cholesterol, such as processed and fried foods, fatty meat, and full-fat dairy. ? Limiting how much salt (sodium) you eat.  Include calcium and vitamin D in your diet. Good sources of calcium and vitamin D include: ? Low-fat dairy products such as milk, yogurt, and cheese. ? Certain fish, such as fresh or canned salmon, tuna, and sardines. ? Products that have calcium and vitamin D added  to them (fortified products), such as fortified cereals or juice. General instructions  Exercise. Talk with your health care provider about what exercises are okay for you. Exercises that increase your heart rate (aerobic exercise), such as walking, are often recommended. Aerobic exercise helps control your blood pressure and  prevent bone loss.  Stay up to date on all vaccines as directed by your health care provider.  Keep all follow-up visits as told by your health care provider. This is important. Contact a health care provider if:  Your symptoms get worse.  You develop signs of infection, such as fever, swelling, redness, warmth, and tenderness. Get help right away if:  You lose your vision.  Your pain does not go away, even after you take medicine.  You have chest pain.  You have trouble breathing.  One side of your face or body suddenly becomes weak or numb. These symptoms may represent a serious problem that is an emergency. Do not wait to see if the symptoms will go away. Get medical help right away. Call your local emergency services (911 in the [place] Do not drive yourself to the hospital. Summary  Temporal arteritis is a condition that causes arteries to become inflamed. It usually affects arteries in your head and face.  This condition can cause serious problems, such as blindness. Treatment can help prevent these problems.  Symptoms may include hard or tender temples, pain in your jaw when chewing, problems with your vision, or pain in your hips and shoulders.  Take over-the-counter and prescription medicines as told by your health care provider. This information is not intended to replace advice given to you by your health care provider. Make sure you discuss any questions you have with your health care provider. Document Released: 12/07/2008 Document Revised: 03/25/2017 Document Reviewed: 03/23/2017 Elsevier Patient Education  2020 [employer].  Myasthenia Gravis Myasthenia gravis (MG) is a long-term (chronic) condition that causes weakness in the muscles you can control (voluntary muscles). MG can affect any voluntary muscle. The muscles most often affected are the ones that control:  Eye movement.  Facial movements.  Swallowing. MG is a disease in which the body's  disease-fighting system (immune system) attacks its own healthy tissues (autoimmune disease). When you have MG, your immune system makes proteins (antibodies) that block the chemical (acetylcholine) that your body needs to send nerve signals to your muscles. This causes muscle weakness. What are the causes? The exact cause of MG is not known. What increases the risk? The following factors may make you more likely to develop this condition:  Having an enlarged thymus gland. The thymus gland is located under the breastbone. It makes certain cells for the immune system.  Having a family history of MG. What are the signs or symptoms? Symptoms of MG may include:  Drooping eyelids.  Double vision.  Muscle weakness that gets worse with activity and gets better after rest.  Difficulty walking.  Trouble chewing and swallowing.  Trouble making facial expressions.  Slurred speech.  Weakness of the arms, hands, and legs. Sudden, severe difficulty breathing (myasthenic crisis) may develop after having:  An infection.  A fever.  A bad reaction to a medicine. Myasthenic crisis requires emergency breathing support. Sometimes symptoms of MG go away for a while (remission) and then come back later. How is this diagnosed? This condition may be diagnosed based on:  Your symptoms and medical history.  A physical exam.  Blood tests.  Tests of your muscle strength and function.  Imaging tests, such as a CT scan or an MRI. How is this treated? The goal of treatment is to improve muscle strength. Treatment may include:  Taking medicine.  Making lifestyle changes that focus on saving your energy.  Doing physical therapy to gain strength.  Having surgery to remove the thymus gland (thymectomy). This may result in a long remission for some people.  Having a procedure to remove the acetylcholine antibodies (plasmapheresis).  Getting emergency breathing support, if you experience  myasthenic crisis. If you experience remission, you may be able to stop treatment and then resume treatment when your symptoms return. Follow these instructions at home:   Take over-the-counter and prescription medicines only as told by your health care provider.  Get plenty of rest and sleep. Take frequent breaks to rest your eyes, especially when in bright light or working on a computer.  Maintain a healthy diet and a healthy weight. Work with your health care provider or a diet and nutrition specialist (dietitian) if you need help.  Do exercises as told by your health care provider or physical therapist.  Do not use any products that contain nicotine or tobacco, such as cigarettes and e-cigarettes. If you need help quitting, ask your health care provider.  Prevent infections by: ? Washing your hands often with soap and water. If soap and water are not available, use hand sanitizer. ? Avoiding contact with other people who are sick. ? Avoiding touching your eyes, nose, and mouth. ? Cleaning surfaces in your home that are touched often using a disinfectant.  Keep all follow-up visits as told by your health care provider. This is important. Contact a health care provider if:  Your symptoms change or get worse, especially after having a fever or infection. Get help right away if:  You have trouble breathing. Summary  Myasthenia gravis (MG) is a long-term (chronic) condition that causes weakness in the muscles you can control (voluntary muscles).  A symptom of MG is muscle weakness that gets worse with activity and gets better after rest.  Sudden, severe difficulty breathing (myasthenic crisis) may develop after having an infection, a fever, or a bad reaction to a medicine.  The goal of treatment is to improve muscle strength. Treatment may include medicines, lifestyle changes, physical therapy, surgery, plasmapheresis, or emergency breathing support. This information is not  intended to replace advice given to you by your health care provider. Make sure you discuss any questions you have with your health care provider. Document Released: 05/18/2000 Document Revised: 02/22/2017 Document Reviewed: 02/22/2017 Elsevier Patient Education  [place].  Cerebral Aneurysm  A cerebral aneurysm is a bulge that occurs in the blood vessel inside the brain. An aneurysm is caused when a weakened part of the blood vessel expands. The blood vessel expands due to the constant pressure from the flow of blood through the weakened blood vessel. As the weakened aneurysm expands, the walls of the aneurysm become weaker. Aneurysms are dangerous because they can leak or burst (rupture). When a cerebral aneurysm ruptures, it causes bleeding in the brain (subarachnoid hemorrhage). The blood flow to the area of the brain supplied by the artery is also reduced. This can cause stroke, seizures, or coma. A ruptured cerebral aneurysm is a medical emergency. This can cause permanent brain damage or death. What are the causes? The exact cause of this condition is not known. What increases the risk? This condition is more likely to develop in people who:  Are older. The condition  is most common in people between the ages of 49-60.  Are female  Have a family history of aneurysm in two or more direct relatives.  Have certain conditions that are passed along from parent to child (inherited). They include: ? Autosomal dominant polycystic kidney disease. This is a condition in which small, fluid-filled sacs (cysts) develop in the kidney. ? Neurofibromatosis type 1. In this condition, flat spots develop under the skin (pigmentation) and tumors grow along nerves in the skin, brain, and other parts of the body. ? Ehlers-Danlos syndrome. This is a condition in which bad connective tissue causes loose or unstable joints and creates a very soft skin that bruises or tears easily.  Smoke.  Have  high blood pressure (hypertension).  Abuse alcohol. What are the signs or symptoms? The signs and symptoms of a cerebral aneurysm that has not leaked or ruptured can depend on its size and rate of growth. A small, unchanging aneurysm generally does not cause symptoms. A larger aneurysm that is steadily growing can increase pressure on the brain or nerves.  The increased pressure from a cerebral aneurysm that has not leaked or ruptured can cause:  A headache.  Vision problems.  Numbness or weakness in an arm or leg.  Memory problems.  Problems speaking.  Seizures. If an aneurysm leaks or ruptures, it can cause a life-threatening condition, such as stroke. Symptoms may include:  A sudden, severe headache with no known cause. The headache is often described as the worst headache ever experienced.  Stiff neck.  Nausea or vomiting, especially when combined with other symptoms, such as a headache.  Sudden weakness or numbness of the face, arm, or leg, especially on one side of the body.  Sudden trouble walking or difficulty moving the arms or legs.  Double vision.  Sudden trouble seeing in one or both eyes.  Trouble speaking or understanding speech (aphasia).  Trouble swallowing.  Dizziness.  Loss of balance or coordination.  Intolerance to light.  Sudden confusion or loss of consciousness. How is this diagnosed? This condition is diagnosed using certain tests, including:  CT scan.  Computed tomographic angiogram (CTA). This test uses a dye and a scanner to produce images of your blood vessels.  Magnetic resonance angiogram (MRA). This test uses an MRI machine to produce images of your blood vessels.  Digital subtraction angiogram (DSA). This test involves placing a flexible, thin tube (catheter) into the artery in your thigh and guiding it up to the arteries in the brain. A dye is then injected into the area and X-rays are taken to create images of your blood  vessels. How is this treated? Unruptured aneurysm Treatment is complex when an aneurysm is found and it is not causing problems. Treatment is individualized, as each case is different. Many factors must be considered, such as the size and exact location of your aneurysm, your age, your overall health, and your preferences. Small aneurysms in certain locations of the brain have a very low chance of bleeding or rupturing. These small aneurysms may not be treated.  In some cases, however, treatment may be required. Treatment depends on the size and location of the aneurysm. They may include:  Coiling. During this procedure, a catheter is inserted and advanced through a blood vessel. Once the catheter reaches the aneurysm, tiny coils are used to block blood flow into the aneurysm. This procedure is sometimes done at the same time as a DSA.  Surgical clipping. During surgery, a clip is placed  at the base of the aneurysm. The clip prevents blood from continuing to enter the aneurysm.  Flow diversion. This procedure is used to divert blood flow around the aneurysm with a stent that is placed across the opening of an aneurysm. Ruptured aneurysm Immediate emergency surgery or coiling may be needed to help prevent damage to the brain and to reduce the risk of rebleeding. The timing of treatment is an important factor in preventing complications. Successful early treatment of a ruptured aneurysm within the first 3 days of a bleed helps to prevent rebleeding and blood vessel spasm. In some cases, there may be a reason to treat 10-14 days after a rupture. Many factors are considered when making this decision, and each case is handled individually. Follow these instructions at home: If your aneurysm is not treated:  Take over-the-counter and prescription medicines only as told by your health care provider.  Follow a diet suggested by your health care provider. Certain dietary changes may be advised to address  high blood pressure (hypertension), such as choosing foods that are low in salt (sodium), saturated fat, trans fat, and cholesterol.  Stay physically active. It is recommended that you get at least 30 minutes of activity on most or all days.  Do not use any products that contain nicotine or tobacco, such as cigarettes and e-cigarettes. If you need help quitting, ask your health care provider.  Limit alcohol intake to no more than 1 drink a day for nonpregnant women and 2 drinks a day for men. One drink equals 12 oz of beer, 5 oz of wine, or 1 oz of hard liquor.  Do not use street drugs. If you need help quitting, ask your health care provider.  Keep all follow-up visits as told by your health care provider. This is important. This includes any referrals, imaging studies, and laboratory tests. Proper follow-up may prevent an aneurysm rupture or a stroke. Get help right away if:  You have a sudden, severe headache with no known cause. This may include a stiff neck.  You have sudden nausea or vomiting with a severe headache.  You have a seizure.  You have other symptoms of stroke. The acronym BEFAST is an easy way to remember the main warning signs of stroke. ? B = Balance problems. Signs include dizziness, sudden trouble walking, or loss of balance. ? E = Eye problems. This includes trouble seeing or a sudden change in vision. ? F = Face changes. This includes sudden weakness or numbness of the face, or the face or eyelid drooping to one side. ? A = Arm weakness or numbness. This happens suddenly and usually on one side of the body. ? S = Speech problems. This includes trouble speaking or trouble understanding. ? T = Time. Time to call 911 or seek emergency care. Do not wait to see if symptoms will go away. Make note of the time your symptoms started. These symptoms may represent a serious problem that is an emergency. Do not wait to see if the symptoms will go away. Get medical help right  away. Call your local emergency services (911 in the [place] Do not drive yourself to the hospital. Summary  An aneurysm is a bulge in an artery.  Aneurysms are dangerous because they can leak or burst (rupture). When a cerebral aneurysm ruptures, it causes bleeding in the brain.  Treatment depends on whether the aneurysm is ruptured. A ruptured aneurysm is a medical emergency.  Get help right away if  you have symptoms of stroke. The acronym BEFAST is an easy way to remember the main warning signs of stroke. This information is not intended to replace advice given to you by your health care provider. Make sure you discuss any questions you have with your health care provider. Document Released: 11/01/2001 Document Revised: 10/29/2017 Document Reviewed: 03/19/2016 Elsevier Patient Education  2020 [employer].

## 2018-09-20 NOTE — Telephone Encounter (Signed)
Medicare/bcbs supp order sent to GI. No auth they will reach out to the patient to schedule.  

## 2018-09-20 NOTE — Progress Notes (Signed)
[facility]    Provider:  Dr Jaynee Eagles Requesting Provider: Vernie Shanks, MD Primary Care Provider:  Vernie Shanks, MD  CC:  Eye pain  HPI:  Wendy [name] is a 78 y.o. female here as requested by Vernie Shanks, MD for right-sided headache r/o myasthenia gravis.  She has a past medical history of mitral valve prolapse, hypertension, chronic leukopenia, history of gastric ulcer, osteopenia, spinal stenosis, arthritis, headache and ptosis of right eyelid.She is here with her husband who also provides information. Started with pain in the right eye, she would have a shooting pain, blurry vision of the right eye, she saw an optometrist and her prescription had changed but more in the left. She has no symptoms in the left eye. She tried the prescription, 2-3 weeks ago symptoms worsened she couldn't see out of her right eye, when she closes the left eye she has a lot of blurriness not improved by closing one eye, no double vision. She has worsening headache, she can feel the pulse and pushing down on the temple area helps, primarily around the right eye, no weakness on chewing, no jaw pain or muscular pain. Ptosis more at night with fatigue. No weakness in the arms and legs, no proximal weakness, climbing stairs well.   Reviewed notes, labs and imaging from outside physicians, which showed:  I reviewed Dr. Jodi Mourning notes.  She was seen for right-sided headache, right lid drooping with fatigue or blinking.  She complained of headache, right eye problem is bothering her, when she tires her right eye droops, she was seen by an optician and Rx was changed to different times and was told the right eye drooping may be Bell's palsy.  On exam she did have right upper eyelid drooping otherwise neurologic and physical exam was unremarkable.  I also reviewed labs which were taken in April 26, 2018 which showed creatinine 1.03, BUN 17 otherwise unremarkable BMP, CBC with white blood cells of 3 otherwise  unremarkable,  Review of Systems: Patient complains of symptoms per HPI as well as the following symptoms: no SOB, no CP, no swallowing difficulties. Pertinent negatives and positives per HPI. All others negative.   Social History   Socioeconomic History  . Marital status: Married    Spouse name: Gwyndolyn Saxon  . Number of children: 3  . Years of education: Not on file  . Highest education level: Associate degree: academic program  Occupational History  . 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 Financial resource strain: Not hard at all  . Food insecurity    Worry: Never true    Inability: Never true  . Transportation needs    Medical: No    Non-medical: No  Tobacco Use  . Smoking status: Never Smoker  . Smokeless tobacco: Never Used  Substance and Sexual Activity  . Alcohol use: No  . Drug use: No  . Sexual activity: Not Currently  Lifestyle  . Physical activity    Days per week: Not on file    Minutes per session: Not on file  . Stress: Not on file  Relationships  . Social Herbalist on phone: Not on file    Gets together: Not on file    Attends religious service: Not on file    Active member of club or organization: Not on file    Attends meetings of clubs or organizations: Not on file    Relationship status:  Not on file  . Intimate partner violence    Fear of current or ex partner: Not on file    Emotionally abused: Not on file    Physically abused: Not on file    Forced sexual activity: Not on file  Other Topics Concern  . Not on file  Social History Narrative   Retired Therapist, sports   Lives with Husband Gwyndolyn Saxon   moved to [place] to be near children   Daughter is Esmond Harps    Family History  Problem Relation Age of Onset  . COPD Mother   . Arthritis Mother   . Hearing loss Mother   . Hypertension Mother   . Stroke Father 77  . Diabetes Father   . Thyroid disease Daughter   . Drug abuse Son   . Cancer Brother        prostate   . Mental illness Brother        PTSD from war  . Heart disease Sister   . Hyperlipidemia Sister   . Hypertension Sister   . Cancer Sister        Leukemia  . Cancer Maternal Aunt        breast    Past Medical History:  Diagnosis Date  . Arthritis    hands, neck lower back  . Cataract    extracted  . GERD (gastroesophageal reflux disease)    prior med  . Hypertension   . Mild hyperlipidemia 01/18/2017    Patient Active Problem List   Diagnosis Date Noted  . Dense breast tissue on mammogram 05/04/2017  . H/O mitral valve prolapse 02/18/2017  . Essential hypertension 01/18/2017  . Chronic GERD 01/18/2017  . History of Helicobacter pylori infection 01/18/2017  . Mild hyperlipidemia 01/18/2017  . Chronic leukopenia 01/18/2017  . Environmental allergies 01/18/2017    Past Surgical History:  Procedure Laterality Date  . CATARACT EXTRACTION, BILATERAL    . SHOULDER ARTHROSCOPY    . SHOULDER ARTHROTOMY Left    about 2007    Current Outpatient Medications  Medication Sig Dispense Refill  . aspirin EC 81 MG tablet Take 81 mg by mouth daily.    . Boswellia-Glucosamine-Vit D (GLUCOSAMINE COMPLEX PO) Take 1 tablet by mouth daily.    Marland Kitchen losartan-hydrochlorothiazide (HYZAAR) 50-12.5 MG tablet Take 1 tablet by mouth daily.    . Multiple Vitamin (MULTIVITAMIN) tablet Take 1 tablet by mouth daily.    Marland Kitchen neomycin-polymyxin-hydrocortisone (CORTISPORIN) 3.5-10000-1 OTIC suspension Place 4 drops into the left ear daily as needed. (Patient not taking: Reported on 09/20/2018) 10 mL 1  . predniSONE (DELTASONE) 20 MG tablet Take 3 tablets (60 mg total) by mouth daily. Take with food. preferably in the morning. 15 tablet 0   No current facility-administered medications for this visit.     Allergies as of 09/20/2018  . (No Known Allergies)    Vitals: BP 130/68   Pulse (!) 57   Temp 98 F (36.7 C) (Oral) Comment (Src): Husbband 's temp 97.7  Ht 4\' 11"  (1.499 m)   Wt 144 lb 9.6 oz  (65.6 kg)   BMI 29.21 kg/m  Last Weight:  Wt Readings from Last 1 Encounters:  09/20/18 144 lb 9.6 oz (65.6 kg)   Last Height:   Ht Readings from Last 1 Encounters:  09/20/18 4\' 11"  (1.499 m)     Physical exam: Exam: Gen: NAD, conversant, well nourised, obese, well groomed  CV: RRR, no MRG. No Carotid Bruits. No peripheral edema, warm, nontender Eyes: Conjunctivae clear without exudates or hemorrhage  Neuro: Detailed Neurologic Exam  Speech:    Speech is normal; fluent and spontaneous with normal comprehension.  Cognition:    The patient is oriented to person, place, and time;     recent and remote memory intact;     language fluent;     normal attention, concentration,     fund of knowledge Cranial Nerves:    The pupils are equal, round, and reactive to light.  Attempted funduscopic exam could not visualize due to small pupils.  Visual fields are full to finger confrontation. Extraocular movements are intact. Trigeminal sensation is intact and the muscles of mastication are normal. Right ptosis, fatiguable upgaze.The palate elevates in the midline. Hearing intact. Voice is normal. Shoulder shrug is normal. The tongue has normal motion without fasciculations.   Coordination:    Normal finger to nose and heel to shin. Normal rapid alternating movements.   Gait:    Heel-toe and tandem gait are normal.   Motor Observation:    No asymmetry, no atrophy, and no involuntary movements noted. Tone:    Normal muscle tone.    Posture:    Posture is normal. normal erect    Strength:    Strength is V/V in the upper and lower limbs.      Sensation: intact to LT     Reflex Exam:  DTR's:    Deep tendon reflexes in the upper and lower extremities are normal bilaterally.   Toes:    The toes are downgoing bilaterally.   Clonus:    Clonus is absent.    Assessment/Plan:  Patient with vision loss in the right eye, ptosis, headache - I am concerned about  temporal arteritis however  ddx including giant cell arteritis, myasthenia gravis (not c/w vision loss), aneurysm, other compressive lesion or mass.   Start steroids asap.   Orders Placed This Encounter  Procedures  . MR BRAIN W WO CONTRAST  . MR ANGIO HEAD WO CONTRAST  . Sedimentation rate  . C-reactive protein  . Acetylcholine receptor, binding  . Acetylcholine receptor, blocking  . Acetylcholine receptor, modulating  . Comprehensive metabolic panel  . CBC with Differential/Platelets  . TSH  . CK   Meds ordered this encounter  Medications  . predniSONE (DELTASONE) 20 MG tablet    Sig: Take 3 tablets (60 mg total) by mouth daily. Take with food. preferably in the morning.    Dispense:  15 tablet    Refill:  0    Cc: Vernie Shanks, MD,    Sarina Ill, MD  [facility] [address] [place], [place] [address]  Phone [phone] Fax [phone]

## 2018-09-21 DIAGNOSIS — R6889 Other general symptoms and signs: Secondary | ICD-10-CM | POA: Diagnosis not present

## 2018-09-22 ENCOUNTER — Telehealth: Payer: Self-pay | Admitting: *Deleted

## 2018-09-22 NOTE — Telephone Encounter (Signed)
I was able to speak to the patient this morning.  She verbalized understanding of her lab results and will stop the steroids.

## 2018-09-22 NOTE — Telephone Encounter (Signed)
-----   Message from Melvenia Beam, MD sent at 09/21/2018  5:25 PM EDT ----- Please let patient know that labs to check for temporal arteritis were normal so she can stop the steroids. Still awaiting several other labs thank you!

## 2018-09-23 LAB — NOVEL CORONAVIRUS, NAA: SARS-CoV-2, NAA: NOT DETECTED

## 2018-09-28 LAB — COMPREHENSIVE METABOLIC PANEL
ALT: 20 IU/L (ref 0–32)
AST: 22 IU/L (ref 0–40)
Albumin/Globulin Ratio: 1.8 (ref 1.2–2.2)
Albumin: 4.6 g/dL (ref 3.7–4.7)
Alkaline Phosphatase: 49 IU/L (ref 39–117)
BUN/Creatinine Ratio: 14 (ref 12–28)
BUN: 14 mg/dL (ref 8–27)
Bilirubin Total: 0.8 mg/dL (ref 0.0–1.2)
CO2: 25 mmol/L (ref 20–29)
Calcium: 10 mg/dL (ref 8.7–10.3)
Chloride: 100 mmol/L (ref 96–106)
Creatinine, Ser: 1.01 mg/dL — ABNORMAL HIGH (ref 0.57–1.00)
GFR calc Af Amer: 62 mL/min/{1.73_m2} (ref >59)
GFR calc non Af Amer: 54 mL/min/{1.73_m2} — ABNORMAL LOW (ref >59)
Globulin, Total: 2.6 g/dL (ref 1.5–4.5)
Glucose: 81 mg/dL (ref 65–99)
Potassium: 3.6 mmol/L (ref 3.5–5.2)
Sodium: 140 mmol/L (ref 134–144)
Total Protein: 7.2 g/dL (ref 6.0–8.5)

## 2018-09-28 LAB — CBC WITH DIFFERENTIAL/PLATELET
Basophils Absolute: 0 10*3/uL (ref 0.0–0.2)
Basos: 1 %
EOS (ABSOLUTE): 0.1 10*3/uL (ref 0.0–0.4)
Eos: 2 %
Hematocrit: 37.4 % (ref 34.0–46.6)
Hemoglobin: 12.9 g/dL (ref 11.1–15.9)
Immature Grans (Abs): 0 10*3/uL (ref 0.0–0.1)
Immature Granulocytes: 0 %
Lymphocytes Absolute: 1.1 10*3/uL (ref 0.7–3.1)
Lymphs: 34 %
MCH: 30.5 pg (ref 26.6–33.0)
MCHC: 34.5 g/dL (ref 31.5–35.7)
MCV: 88 fL (ref 79–97)
Monocytes Absolute: 0.4 10*3/uL (ref 0.1–0.9)
Monocytes: 13 %
Neutrophils Absolute: 1.6 10*3/uL (ref 1.4–7.0)
Neutrophils: 50 %
Platelets: 181 10*3/uL (ref 150–450)
RBC: 4.23 x10E6/uL (ref 3.77–5.28)
RDW: 12.1 % (ref 11.7–15.4)
WBC: 3.2 10*3/uL — ABNORMAL LOW (ref 3.4–10.8)

## 2018-09-28 LAB — SEDIMENTATION RATE: Sed Rate: 20 mm/hr (ref 0–40)

## 2018-09-28 LAB — ACETYLCHOLINE RECEPTOR, BINDING: AChR Binding Ab, Serum: 0.03 nmol/L (ref 0.00–0.24)

## 2018-09-28 LAB — C-REACTIVE PROTEIN: CRP: <1 mg/L (ref 0–10)

## 2018-09-28 LAB — ACETYLCHOLINE RECEPTOR, BLOCKING: Acetylchol Block Ab: 9 % (ref 0–25)

## 2018-09-28 LAB — TSH: TSH: 1.08 u[IU]/mL (ref 0.450–4.500)

## 2018-09-28 LAB — ACETYLCHOLINE RECEPTOR, MODULATING: Acetylcholine Modulat Ab: <12 % (ref 0–20)

## 2018-09-28 LAB — CK: Total CK: 154 U/L (ref 32–182)

## 2018-10-15 ENCOUNTER — Ambulatory Visit
Admission: RE | Admit: 2018-10-15 | Discharge: 2018-10-15 | Disposition: A | Discharge status: Home / Self Care | Payer: Medicare Other | Source: Ambulatory Visit | Attending: Neurology | Admitting: Neurology

## 2018-10-15 ENCOUNTER — Other Ambulatory Visit: Payer: Self-pay

## 2018-10-15 DIAGNOSIS — H02401 Unspecified ptosis of right eyelid: Secondary | ICD-10-CM | POA: Diagnosis not present

## 2018-10-15 DIAGNOSIS — R519 Headache, unspecified: Secondary | ICD-10-CM

## 2018-10-15 DIAGNOSIS — R51 Headache: Secondary | ICD-10-CM | POA: Diagnosis not present

## 2018-10-15 DIAGNOSIS — H5461 Unqualified visual loss, right eye, normal vision left eye: Secondary | ICD-10-CM

## 2018-10-15 DIAGNOSIS — I671 Cerebral aneurysm, nonruptured: Secondary | ICD-10-CM

## 2018-10-15 MED ORDER — GADOBENATE DIMEGLUMINE 529 MG/ML IV SOLN
10.0000 mL | Freq: Once | INTRAVENOUS | Status: AC | PRN
  Administered 2018-10-15: 10 mL via INTRAVENOUS

## 2018-10-16 ENCOUNTER — Telehealth: Payer: Self-pay | Admitting: Neurology

## 2018-10-16 NOTE — Telephone Encounter (Signed)
Please call patient and ask if she is willing to have a CTA of the head. MRA of the head shows a possible small aneurysm. I would like to perform a follow up test to see if the aneurysm is really there which is a CT scan which is beter at looking at the arteries in the area where the MRI thinks there may be a small aneurysm. Please let me know if she agrees and I can order the CTA thanks

## 2018-10-17 NOTE — Telephone Encounter (Signed)
Spoke with Dr. Jaynee Eagles. She advised to cancel 8/25 f/u, get the CT-A with results, and then follow-up. I spoke with the patient and she agreed to plan. 8/25 appt canceled. She will await a call from staff to schedule CT-A. Pt verbalized appreciation.

## 2018-10-17 NOTE — Telephone Encounter (Signed)
I spoke with the patient and discussed Dr. Cathren Laine message. Pt understands the MRA shows there may be a small aneurysm and Dr. Jaynee Eagles would like to order a CT-A to take a better look to see if the aneurysm is really there. The pt agrees to have the CT-A. Her questions were answered. She would like to know if she needs the f/u appt tomorrow. She stated she would do whatever Dr. Jaynee Eagles says. Pt aware Dr. Jaynee Eagles is unavailable at this time but I will call her first thing tomorrow morning to let pt know. She verbalized appreciation.

## 2018-10-18 ENCOUNTER — Ambulatory Visit: Payer: Medicare Other | Admitting: Neurology

## 2018-10-24 DIAGNOSIS — H02401 Unspecified ptosis of right eyelid: Secondary | ICD-10-CM | POA: Diagnosis not present

## 2018-10-24 DIAGNOSIS — R51 Headache: Secondary | ICD-10-CM | POA: Diagnosis not present

## 2018-10-24 DIAGNOSIS — Z961 Presence of intraocular lens: Secondary | ICD-10-CM | POA: Diagnosis not present

## 2018-11-02 ENCOUNTER — Other Ambulatory Visit: Payer: Self-pay | Admitting: Neurology

## 2018-11-02 ENCOUNTER — Telehealth: Payer: Self-pay | Admitting: Neurology

## 2018-11-02 DIAGNOSIS — I671 Cerebral aneurysm, nonruptured: Secondary | ICD-10-CM

## 2018-11-02 NOTE — Telephone Encounter (Signed)
Pt has called in response to previous entry from [employer].  Pt states she has not heard from anyone re: the scheduling of her CT-A.  Please call with an update

## 2018-11-02 NOTE — Telephone Encounter (Signed)
Medicare/bcbs supp order sent to GI. No auth they will reach out to the patient to schedule.  

## 2018-11-02 NOTE — Telephone Encounter (Signed)
Spoke with the patient. I updated her that the CT-A order has been placed and authorization will be done, and she will receive a call. Pt's questions were answered. She verbalized appreciation.

## 2018-11-02 NOTE — Telephone Encounter (Signed)
Done. thanks

## 2018-11-03 NOTE — Telephone Encounter (Signed)
Spoke with pt. She had misunderstood and thought she was getting a cerebral arteriogram. All of her questions were answered. She understands this is a CT, not a procedural arteriogram. She verbalized appreciation. Scan pending for 11/09/2018.

## 2018-11-03 NOTE — Telephone Encounter (Signed)
Pt has called asking that RN Romelle Starcher calls her to clarify what scans have been ordered for her

## 2018-11-03 NOTE — Telephone Encounter (Signed)
I spoke with the pt and updated her that GI has the order for the CT-A and she is supposed to receive a call from them to schedule the CT. I also gave her GI's number to call if she would like. She verbalized appreciation for the call.

## 2018-11-09 ENCOUNTER — Ambulatory Visit
Admission: RE | Admit: 2018-11-09 | Discharge: 2018-11-09 | Disposition: A | Discharge status: Home / Self Care | Payer: Medicare Other | Source: Ambulatory Visit | Attending: Neurology | Admitting: Neurology

## 2018-11-09 DIAGNOSIS — I671 Cerebral aneurysm, nonruptured: Secondary | ICD-10-CM

## 2018-11-09 MED ORDER — IOPAMIDOL (ISOVUE-370) INJECTION 76%
75.0000 mL | Freq: Once | INTRAVENOUS | Status: AC | PRN
  Administered 2018-11-09: 75 mL via INTRAVENOUS

## 2018-12-16 DIAGNOSIS — Z23 Encounter for immunization: Secondary | ICD-10-CM | POA: Diagnosis not present

## 2019-02-09 ENCOUNTER — Other Ambulatory Visit: Payer: Self-pay

## 2019-02-09 ENCOUNTER — Ambulatory Visit: Payer: Medicare Other | Attending: Internal Medicine

## 2019-02-09 DIAGNOSIS — Z20822 Contact with and (suspected) exposure to covid-19: Secondary | ICD-10-CM

## 2019-02-10 LAB — NOVEL CORONAVIRUS, NAA: SARS-CoV-2, NAA: NOT DETECTED

## 2019-02-16 ENCOUNTER — Other Ambulatory Visit (HOSPITAL_COMMUNITY): Payer: Self-pay | Admitting: Family Medicine

## 2019-02-16 DIAGNOSIS — Z1231 Encounter for screening mammogram for malignant neoplasm of breast: Secondary | ICD-10-CM

## 2019-02-20 ENCOUNTER — Ambulatory Visit: Payer: Self-pay | Admitting: *Deleted

## 2019-02-20 NOTE — Telephone Encounter (Signed)
Pt called stating she was exposed to someone who tested positive and she has some questions and is requesting a call back. Please advise.   Patient had COVID exposure in the home yesterday- they did wear mask- but were in the home an extended period of time. Patient advised to contact PCP- not test too early.  Reason for Disposition . [1] CLOSE CONTACT COVID-19 EXPOSURE within last 14 days AND [2] NO symptoms  Answer Assessment - Initial Assessment Questions 1. COVID-19 CLOSE CONTACT: "Who is the person with the confirmed or suspected COVID-19 infection that you were exposed to?"     Family member 2. PLACE of CONTACT: "Where were you when you were exposed to COVID-19?" (e.g., home, school, medical waiting room; which city?)     In the home 3. TYPE of CONTACT: "How much contact was there?" (e.g., sitting next to, live in same house, work in same office, same building)     Visiting in home 4. DURATION of CONTACT: "How long were you in contact with the COVID-19 patient?" (e.g., a few seconds, passed by person, a few minutes, 15 minutes or longer, live with the patient)     30-45 minutes 5. MASK: "Were you wearing a mask?" "Was the other person wearing a mask?" Note: wearing a mask reduces the risk of an  otherwise close contact.     yes- both parties 6. DATE of CONTACT: "When did you have contact with a COVID-19 patient?" (e.g., how many days ago)     yesterday 7. COMMUNITY SPREAD: "Are there lots of cases of COVID-19 (community spread) where you live?" (See public health department website, if unsure)       Community spread 8. SYMPTOMS: "Do you have any symptoms?" (e.g., fever, cough, breathing difficulty, loss of taste or smell)     Headache, sore throat 9. PREGNANCY OR POSTPARTUM: "Is there any chance you are pregnant?" "When was your last menstrual period?" "Did you deliver in the last 2 weeks?"     n/a 10. HIGH RISK: "Do you have any heart or lung problems? Do you have a weak immune  system?" (e.g., heart failure, COPD, asthma, HIV positive, chemotherapy, renal failure, diabetes mellitus, sickle cell anemia, obesity)       Age, high BP, WBC low 11.  TRAVEL: "Have you traveled out of the country recently?" If so, "When and where?"  Also ask about out-of-state travel, since the CDC has identified some high-risk cities for community spread in the [place].  Note: Travel becomes less relevant if there is widespread community transmission where the patient lives.       n/a  Protocols used: CORONAVIRUS (COVID-19) EXPOSURE-A-AH

## 2019-03-20 ENCOUNTER — Ambulatory Visit (HOSPITAL_COMMUNITY)
Admission: RE | Admit: 2019-03-20 | Discharge: 2019-03-20 | Disposition: A | Discharge status: Home / Self Care | Payer: Medicare Other | Source: Ambulatory Visit | Attending: Family Medicine | Admitting: Family Medicine

## 2019-03-20 ENCOUNTER — Other Ambulatory Visit: Payer: Self-pay

## 2019-03-20 DIAGNOSIS — Z1231 Encounter for screening mammogram for malignant neoplasm of breast: Secondary | ICD-10-CM | POA: Insufficient documentation

## 2019-03-22 DIAGNOSIS — N644 Mastodynia: Secondary | ICD-10-CM | POA: Diagnosis not present

## 2019-03-31 DIAGNOSIS — Z23 Encounter for immunization: Secondary | ICD-10-CM | POA: Diagnosis not present

## 2019-03-31 DIAGNOSIS — H02403 Unspecified ptosis of bilateral eyelids: Secondary | ICD-10-CM | POA: Diagnosis not present

## 2019-03-31 DIAGNOSIS — H52203 Unspecified astigmatism, bilateral: Secondary | ICD-10-CM | POA: Diagnosis not present

## 2019-04-29 DIAGNOSIS — Z23 Encounter for immunization: Secondary | ICD-10-CM | POA: Diagnosis not present

## 2019-05-03 DIAGNOSIS — H608X3 Other otitis externa, bilateral: Secondary | ICD-10-CM | POA: Diagnosis not present

## 2019-05-03 DIAGNOSIS — H903 Sensorineural hearing loss, bilateral: Secondary | ICD-10-CM | POA: Diagnosis not present

## 2019-05-03 DIAGNOSIS — H838X3 Other specified diseases of inner ear, bilateral: Secondary | ICD-10-CM | POA: Diagnosis not present

## 2019-05-22 ENCOUNTER — Other Ambulatory Visit: Payer: Self-pay | Admitting: Nurse Practitioner

## 2019-05-22 DIAGNOSIS — N644 Mastodynia: Secondary | ICD-10-CM

## 2019-06-08 ENCOUNTER — Ambulatory Visit: Payer: Medicare Other

## 2019-06-08 ENCOUNTER — Other Ambulatory Visit: Payer: Self-pay

## 2019-06-08 ENCOUNTER — Ambulatory Visit
Admission: RE | Admit: 2019-06-08 | Discharge: 2019-06-08 | Disposition: A | Discharge status: Home / Self Care | Payer: Medicare Other | Source: Ambulatory Visit | Attending: Nurse Practitioner | Admitting: Nurse Practitioner

## 2019-06-08 DIAGNOSIS — N644 Mastodynia: Secondary | ICD-10-CM

## 2019-06-08 DIAGNOSIS — R922 Inconclusive mammogram: Secondary | ICD-10-CM | POA: Diagnosis not present

## 2019-07-14 DIAGNOSIS — H903 Sensorineural hearing loss, bilateral: Secondary | ICD-10-CM | POA: Diagnosis not present

## 2019-07-14 DIAGNOSIS — H6982 Other specified disorders of Eustachian tube, left ear: Secondary | ICD-10-CM | POA: Diagnosis not present

## 2019-09-05 ENCOUNTER — Other Ambulatory Visit: Payer: Self-pay

## 2019-09-05 ENCOUNTER — Encounter: Payer: Self-pay | Admitting: Cardiology

## 2019-09-05 ENCOUNTER — Ambulatory Visit: Payer: Medicare Other | Admitting: Cardiology

## 2019-09-05 VITALS — BP 140/72 | HR 52 | Resp 15 | Ht 59.0 in | Wt 142.0 lb

## 2019-09-05 DIAGNOSIS — R0989 Other specified symptoms and signs involving the circulatory and respiratory systems: Secondary | ICD-10-CM

## 2019-09-05 DIAGNOSIS — R9431 Abnormal electrocardiogram [ECG] [EKG]: Secondary | ICD-10-CM

## 2019-09-05 DIAGNOSIS — R001 Bradycardia, unspecified: Secondary | ICD-10-CM

## 2019-09-05 DIAGNOSIS — I10 Essential (primary) hypertension: Secondary | ICD-10-CM

## 2019-09-05 NOTE — Progress Notes (Signed)
Date:  09/06/2019   ID:  Wendy [name], DOB 04/16/1940, MRN [id number]  PCP:  Vernie Shanks, MD  Cardiologist:  Rex Kras, DO, [facility] (established care 09/05/2019)  REASON FOR CONSULT: Bradycardia and Fatigue.   REQUESTING PHYSICIAN:  Vernie Shanks, MD [address] [place],  [place] [address]  Chief Complaint  Patient presents with  . New Patient (Initial Visit)  . Fatigue  . Sinus Bradycardia    HPI  Wendy [name] is a 79 y.o. female who presents to the office with a chief complaint of " evaluation of low heart rate and fatigue." She is referred to the office at the request of Vernie Shanks, MD. Patient's past medical history and cardiovascular risk factors include: Sinus bradycardia, hypertension, postmenopausal female, advanced age.  Patient was referred to the office at the request of her primary care provider for evaluation of bradycardia and generalized fatigue.  Patient states that she was recently at her PCPs office and noted that she had been feeling more tired and fatigued than usual.  She also mentioned to her primary care provider that she has been running out of breath after walking certain distances at times.  She denies any chest pain at rest or with effort related activities.  She was under the care of a cardiologist back in [place] but does not recall his or her name.  Patient states that she is known to have a low heart rate and her ventricular rates are usually in the 50 bpm range.  Patient denies lightheadedness, dizziness, near syncope or syncopal events.  Denies prior history of coronary artery disease, myocardial infarction, congestive heart failure, deep venous thrombosis, pulmonary embolism, stroke, transient ischemic attack.  FUNCTIONAL STATUS: No structured exercise program or daily routine. But takes care of her house chores and yardwork.    ALLERGIES: No Known Allergies  MEDICATION LIST PRIOR TO VISIT: Current Meds  Medication Sig  . aspirin EC  81 MG tablet Take 81 mg by mouth daily.  . Boswellia-Glucosamine-Vit D (GLUCOSAMINE COMPLEX PO) Take 1 tablet by mouth daily.  Marland Kitchen losartan-hydrochlorothiazide (HYZAAR) 50-12.5 MG tablet Take 1 tablet by mouth daily.  . Multiple Vitamin (MULTIVITAMIN) tablet Take 1 tablet by mouth daily.     PAST MEDICAL HISTORY: Past Medical History:  Diagnosis Date  . Arthritis    hands, neck lower back  . Cataract    extracted  . GERD (gastroesophageal reflux disease)    prior med  . Hypertension     PAST SURGICAL HISTORY: Past Surgical History:  Procedure Laterality Date  . CATARACT EXTRACTION, BILATERAL    . SHOULDER ARTHROSCOPY    . SHOULDER ARTHROTOMY Left    about 2007    FAMILY HISTORY: The patient family history includes Arthritis in her mother; COPD in her mother; Cancer in her brother, maternal aunt, and sister; Diabetes in her father; Drug abuse in her son; Hearing loss in her mother; Heart disease in her sister; Hyperlipidemia in her sister; Hypertension in her mother and sister; Mental illness in her brother; Stroke (age of onset: 36) in her father; Thyroid disease in her daughter.  SOCIAL HISTORY:  The patient  reports that she has never smoked. She has never used smokeless tobacco. She reports that she does not drink alcohol and does not use drugs.  REVIEW OF SYSTEMS: Review of Systems  Constitutional: Negative for chills and fever.  HENT: Negative for hoarse voice and nosebleeds.   Eyes: Negative for discharge, double vision and  pain.  Cardiovascular: Negative for chest pain, claudication, dyspnea on exertion, leg swelling, near-syncope, orthopnea, palpitations, paroxysmal nocturnal dyspnea and syncope.  Respiratory: Negative for hemoptysis and shortness of breath.   Musculoskeletal: Negative for muscle cramps and myalgias.  Gastrointestinal: Positive for heartburn. Negative for abdominal pain, constipation, diarrhea, hematemesis, hematochezia, melena, nausea and vomiting.   Neurological: Negative for dizziness and light-headedness.    PHYSICAL EXAM: Vitals with BMI 09/05/2019 09/20/2018 11/15/2017  Height 4\' 11"  4\' 11"  -  Weight 142 lbs 144 lbs 10 oz -  BMI 35.32 99.24 -  Systolic 268 341 962  Diastolic 72 68 85  Pulse 52 57 82   CONSTITUTIONAL: Well-developed and well-nourished. No acute distress.  SKIN: Skin is warm and dry. No rash noted. No cyanosis. No pallor. No jaundice HEAD: Normocephalic and atraumatic.  EYES: No scleral icterus MOUTH/THROAT: Moist oral membranes.  NECK: No JVD present. No thyromegaly noted. Right carotid bruits  LYMPHATIC: No visible cervical adenopathy.  CHEST Normal respiratory effort. No intercostal retractions  LUNGS: Clear to auscultation bilaterally. No stridor. No wheezes. No rales.  CARDIOVASCULAR: Regular rate and rhythm, positive S1-S2, no murmurs rubs or gallops appreciated. ABDOMINAL: No apparent ascites.  EXTREMITIES: No peripheral edema, bilateral +2 PT and +1 DP.  HEMATOLOGIC: No significant bruising NEUROLOGIC: Oriented to person, place, and time. Nonfocal. Normal muscle tone.  PSYCHIATRIC: Normal mood and affect. Normal behavior. Cooperative  CARDIAC DATABASE: EKG: 09/05/2019: Sinus  Bradycardia, 50 bpm, left atrial enlargement, normal axis, poor R wave progression, nonspecific ST-T changes, consider old anterior infarct, without myocardial injury pattern.  Echocardiogram: Outside facility, atleast 4 years ago.   Stress Testing: Outside facility, atleast 4 years ago.   Heart Catheterization: None  LABORATORY DATA: CBC Latest Ref Rng & Units 09/20/2018 01/18/2017  WBC 3.4 - 10.8 x10E3/uL 3.2(L) 2.6(L)  Hemoglobin 11.1 - 15.9 g/dL 12.9 12.9  Hematocrit 34.0 - 46.6 % 37.4 37.8  Platelets 150 - 450 x10E3/uL 181 166    CMP Latest Ref Rng & Units 09/20/2018 01/18/2017  Glucose 65 - 99 mg/dL 81 84  BUN 8 - 27 mg/dL 14 17  Creatinine 0.57 - 1.00 mg/dL 1.01(H) 0.88  Sodium 134 - 144 mmol/L 140 140   Potassium 3.5 - 5.2 mmol/L 3.6 4.0  Chloride 96 - 106 mmol/L 100 102  CO2 20 - 29 mmol/L 25 31  Calcium 8.7 - 10.3 mg/dL 10.0 9.9  Total Protein 6.0 - 8.5 g/dL 7.2 7.1  Total Bilirubin 0.0 - 1.2 mg/dL 0.8 1.3(H)  Alkaline Phos 39 - 117 IU/L 49 -  AST 0 - 40 IU/L 22 24  ALT 0 - 32 IU/L 20 20    Lipid Panel     Component Value Date/Time   CHOL 186 01/18/2017 1257   TRIG 49 01/18/2017 1257   HDL 128 01/18/2017 1257   CHOLHDL 1.5 01/18/2017 1257   LDLCALC 44 01/18/2017 1257    No components found for: NTPROBNP No results for input(s): PROBNP in the last 8760 hours. Recent Labs    09/20/18 1421  TSH 1.080    BMP Recent Labs    09/20/18 1421  NA 140  K 3.6  CL 100  CO2 25  GLUCOSE 81  BUN 14  CREATININE 1.01*  CALCIUM 10.0  GFRNONAA 54*  GFRAA 62    HEMOGLOBIN A1C Lab Results  Component Value Date   HGBA1C 5.6% 05/22/2017    IMPRESSION:    ICD-10-CM   1. Sinus bradycardia  R00.1 EKG 12-Lead  PCV ECHOCARDIOGRAM COMPLETE    LONG TERM MONITOR (3-14 DAYS)  2. Benign hypertension  I10   3. Nonspecific abnormal electrocardiogram (ECG) (EKG)  R94.31 PCV ECHOCARDIOGRAM COMPLETE    PCV MYOCARDIAL PERFUSION WO LEXISCAN  4. Bruit of right carotid artery  R09.89 PCV CAROTID DUPLEX (BILATERAL)     RECOMMENDATIONS: Wendy [name] is a 79 y.o. female whose past medical history and cardiac risk factors include: Sinus bradycardia, hypertension, postmenopausal female, advanced age.  Sinus bradycardia:  Plan 24-hour Holter monitor to evaluate for underlying dysrhythmias and/or pauses.  Echocardiogram will be ordered to evaluate for structural heart disease and left ventricular systolic function.  Nuclear stress test recommended to evaluate for reversible ischemia.  We will request lab work from her PCP (CBC, thyroid function, BMP, lipid profile).  Benign essential hypertension: Currently managed by primary team.  Right carotid bruit: Check carotid duplex to  evaluate for carotid artery atherosclerosis.  FINAL MEDICATION LIST END OF ENCOUNTER: No orders of the defined types were placed in this encounter.   Current Outpatient Medications:  .  aspirin EC 81 MG tablet, Take 81 mg by mouth daily., Disp: , Rfl:  .  Boswellia-Glucosamine-Vit D (GLUCOSAMINE COMPLEX PO), Take 1 tablet by mouth daily., Disp: , Rfl:  .  losartan-hydrochlorothiazide (HYZAAR) 50-12.5 MG tablet, Take 1 tablet by mouth daily., Disp: , Rfl:  .  Multiple Vitamin (MULTIVITAMIN) tablet, Take 1 tablet by mouth daily., Disp: , Rfl:   Orders Placed This Encounter  Procedures  . PCV MYOCARDIAL PERFUSION WO LEXISCAN  . LONG TERM MONITOR (3-14 DAYS)  . EKG 12-Lead  . PCV ECHOCARDIOGRAM COMPLETE  . PCV CAROTID DUPLEX (BILATERAL)    There are no Patient Instructions on file for this visit.   --Continue cardiac medications as reconciled in final medication list. --Return in about 4 weeks (around 10/03/2019) for re-evaluation of symptoms., review test results.. Or sooner if needed. --Continue follow-up with your primary care physician regarding the management of your other chronic comorbid conditions.  Patient's questions and concerns were addressed to her satisfaction. She voices understanding of the instructions provided during this encounter.   This note was created using a voice recognition software as a result there may be grammatical errors inadvertently enclosed that do not reflect the nature of this encounter. Every attempt is made to correct such errors.  Rex Kras, [place], [facility]  Pager: [phone] Office: [phone]

## 2019-09-08 ENCOUNTER — Ambulatory Visit: Payer: Medicare Other

## 2019-09-08 ENCOUNTER — Other Ambulatory Visit: Payer: Self-pay

## 2019-09-08 DIAGNOSIS — R001 Bradycardia, unspecified: Secondary | ICD-10-CM | POA: Diagnosis not present

## 2019-09-08 DIAGNOSIS — R0989 Other specified symptoms and signs involving the circulatory and respiratory systems: Secondary | ICD-10-CM

## 2019-09-08 DIAGNOSIS — R9431 Abnormal electrocardiogram [ECG] [EKG]: Secondary | ICD-10-CM | POA: Diagnosis not present

## 2019-09-21 DIAGNOSIS — R001 Bradycardia, unspecified: Secondary | ICD-10-CM | POA: Diagnosis not present

## 2019-09-25 ENCOUNTER — Other Ambulatory Visit: Payer: Self-pay

## 2019-09-25 ENCOUNTER — Ambulatory Visit: Payer: Medicare Other

## 2019-09-25 DIAGNOSIS — R9431 Abnormal electrocardiogram [ECG] [EKG]: Secondary | ICD-10-CM

## 2019-10-02 DIAGNOSIS — R001 Bradycardia, unspecified: Secondary | ICD-10-CM | POA: Diagnosis not present

## 2019-10-03 ENCOUNTER — Other Ambulatory Visit: Payer: Self-pay

## 2019-10-03 ENCOUNTER — Encounter: Payer: Self-pay | Admitting: Cardiology

## 2019-10-03 ENCOUNTER — Ambulatory Visit: Payer: Medicare Other | Admitting: Cardiology

## 2019-10-03 VITALS — BP 126/71 | HR 60 | Resp 16 | Ht 59.0 in | Wt 146.7 lb

## 2019-10-03 DIAGNOSIS — I10 Essential (primary) hypertension: Secondary | ICD-10-CM

## 2019-10-03 DIAGNOSIS — Z712 Person consulting for explanation of examination or test findings: Secondary | ICD-10-CM

## 2019-10-03 DIAGNOSIS — R001 Bradycardia, unspecified: Secondary | ICD-10-CM

## 2019-10-03 NOTE — Progress Notes (Signed)
ID:  Wendy [name], DOB 01-17-1941, MRN [id number]  PCP:  Vernie Shanks, MD  Cardiologist:  Rex Kras, DO, [facility] (established care 09/05/2019)  Date: 10/08/19 Last Office Visit: 09/05/2019   Chief Complaint  Patient presents with  . Sinus Bradycardia  . Follow-up    4 week    HPI  Wendy [name] is a 79 y.o. female who presents to the office with a chief complaint of " reevaluation of bradycardia review test results." Patient's past medical history and cardiovascular risk factors include: Sinus bradycardia, hypertension, postmenopausal female, advanced age.  Patient is accompanied by her husband at today's office visit.  Patient was referred to the office at the request of her primary care provider for evaluation of bradycardia and generalized fatigue.  Patient states that she was recently at her PCPs office and noted that she had been feeling more tired and fatigued than usual.  She also mentioned to her primary care provider that she has been running out of breath after walking certain distances at times.  She denies any chest pain at rest or with effort related activities.  She was under the care of a cardiologist back in [place] but does not recall his or her name.  Patient states that she is known to have a low heart rate and her ventricular rates are usually in the 50 bpm range. Patient denied lightheadedness, dizziness, near syncope or syncopal events.  No new symptoms since last office visit.  Last visit patient underwent an echocardiogram which noted preserved left ventricular systolic function mild valvular heart disease.  She also underwent carotid duplex which only noted minimal plaque within the left internal carotid artery.  She also underwent a 24-hour Holter monitor which noted her heart rate to range between 42-154 bpm with average heart rate of 61 bpm.  No evidence of atrial fibrillation during the monitoring period.  No significant supraventricular or ventricular burden.   Patient also underwent an exercise nuclear stress test.  Patient was able to achieve 91% of maximum predicted heart rate proving that she does have chronotropic competence.  Normal myocardial perfusion.  Overall low risk study.  Outside labs notes hemoglobin within normal limits and TSH within normal limits.  Denies prior history of coronary artery disease, myocardial infarction, congestive heart failure, deep venous thrombosis, pulmonary embolism, stroke, transient ischemic attack.  FUNCTIONAL STATUS: No structured exercise program or daily routine. But takes care of her house chores and yardwork.    ALLERGIES: No Known Allergies  MEDICATION LIST PRIOR TO VISIT: Current Meds  Medication Sig  . aspirin EC 81 MG tablet Take 81 mg by mouth daily.  . Boswellia-Glucosamine-Vit D (GLUCOSAMINE COMPLEX PO) Take 1 tablet by mouth daily.  Marland Kitchen losartan-hydrochlorothiazide (HYZAAR) 50-12.5 MG tablet Take 1 tablet by mouth daily.  . Multiple Vitamin (MULTIVITAMIN) tablet Take 1 tablet by mouth daily.  . Omega-3 Fatty Acids (FISH OIL OMEGA-3 PO) Take 1 capsule by mouth.     PAST MEDICAL HISTORY: Past Medical History:  Diagnosis Date  . Arthritis    hands, neck lower back  . Cataract    extracted  . GERD (gastroesophageal reflux disease)    prior med  . Hypertension     PAST SURGICAL HISTORY: Past Surgical History:  Procedure Laterality Date  . CATARACT EXTRACTION, BILATERAL    . SHOULDER ARTHROSCOPY    . SHOULDER ARTHROTOMY Left    about 2007    FAMILY HISTORY: The patient family history includes Arthritis in her mother;  COPD in her mother; Cancer in her brother, maternal aunt, and sister; Diabetes in her father; Drug abuse in her son; Hearing loss in her mother; Heart disease in her sister; Hyperlipidemia in her sister; Hypertension in her mother and sister; Mental illness in her brother; Stroke (age of onset: 72) in her father; Thyroid disease in her daughter.  SOCIAL HISTORY:  The  patient  reports that she has never smoked. She has never used smokeless tobacco. She reports that she does not drink alcohol and does not use drugs.  REVIEW OF SYSTEMS: Review of Systems  Constitutional: Negative for chills and fever.  HENT: Negative for hoarse voice and nosebleeds.   Eyes: Negative for discharge, double vision and pain.  Cardiovascular: Negative for chest pain, claudication, dyspnea on exertion, leg swelling, near-syncope, orthopnea, palpitations, paroxysmal nocturnal dyspnea and syncope.  Respiratory: Negative for hemoptysis and shortness of breath.   Musculoskeletal: Negative for muscle cramps and myalgias.  Gastrointestinal: Positive for heartburn. Negative for abdominal pain, constipation, diarrhea, hematemesis, hematochezia, melena, nausea and vomiting.  Neurological: Negative for dizziness and light-headedness.    PHYSICAL EXAM: Vitals with BMI 10/03/2019 09/05/2019 09/20/2018  Height '4\' 11"'$  '4\' 11"'$  '4\' 11"'$   Weight 146 lbs 11 oz 142 lbs 144 lbs 10 oz  BMI 29.61 44.01 02.72  Systolic 536 644 034  Diastolic 71 72 68  Pulse 60 52 57   CONSTITUTIONAL: Well-developed and well-nourished. No acute distress.  SKIN: Skin is warm and dry. No rash noted. No cyanosis. No pallor. No jaundice HEAD: Normocephalic and atraumatic.  EYES: No scleral icterus MOUTH/THROAT: Moist oral membranes.  NECK: No JVD present. No thyromegaly noted. Right carotid bruits  LYMPHATIC: No visible cervical adenopathy.  CHEST Normal respiratory effort. No intercostal retractions  LUNGS: Clear to auscultation bilaterally. No stridor. No wheezes. No rales.  CARDIOVASCULAR: Regular rate and rhythm, positive S1-S2, no murmurs rubs or gallops appreciated. ABDOMINAL: No apparent ascites.  EXTREMITIES: No peripheral edema, bilateral +2 PT and +1 DP.  HEMATOLOGIC: No significant bruising NEUROLOGIC: Oriented to person, place, and time. Nonfocal. Normal muscle tone.  PSYCHIATRIC: Normal mood and affect.  Normal behavior. Cooperative  CARDIAC DATABASE: EKG: 09/05/2019: Sinus  Bradycardia, 50 bpm, left atrial enlargement, normal axis, poor R wave progression, nonspecific ST-T changes, consider old anterior infarct, without myocardial injury pattern.  Echocardiogram: 09/08/2019: Normal LV systolic function with visual EF 60-65%. Left ventricle cavity is normal in size. Normal global wall motion. Normal diastolic filling pattern, normal LAP. Calculated EF 65%. Mild (Grade I) mitral regurgitation. Mild tricuspid regurgitation. No prior study for comparison.  Stress Testing: Exercise Myoview stress test 09/25/2019: Exercise nuclear stress test was performed using Bruce protocol. Patient reached 7.7 METS, and 91% of age predicted maximum heart rate. Exercise capacity was fair. Chest pain not reported. Normal heart rate and hemodynamic response.  Peak EKG/ECG demonstrated sinus tachycardia. occasional PAC's, 1 mm horizontal ST depression present in leads II, III, aVF, V4-V6. EKG changes normalized within 1 min into recovery. Given norma myocardial perfusion, EKG changes are likely false positive.  Normal myocardial perfusion. Stress LVEF 69%. Low risk study.  Heart Catheterization: None  Carotid artery duplex  74/25/9563: Peak systolic velocities in the right bifurcation, internal, external and common carotid arteries are within normal limits. Minimal stenosis in the left internal carotid artery (minimal) with homogeneous plaque. Antegrade right vertebral artery flow. Antegrade left vertebral artery flow.  24 hour Holter monitor: Dominant rhythm normal sinus. Heart rate 42-154 bpm.  Average heart rate 61 bpm.  Minimum heart rate of 42 bpm occurred at 4:10am on 09/09/2019.  No atrial fibrillation/ventricular tachycardia/high grade AV block, sinus pause greater than or equal to 3 seconds in duration. Total ventricular ectopic burden <1%. Total supraventricular ectopic burden <1%. One auto  detected episode of supraventricular tachycardia was 11 beats in duration at a maximum rate of 154 bpm. Number of patient triggered events: 2.  Underlying rhythm is normal sinus without any significant dysrhythmias.  LABORATORY DATA: CBC Latest Ref Rng & Units 09/20/2018 01/18/2017  WBC 3.4 - 10.8 x10E3/uL 3.2(L) 2.6(L)  Hemoglobin 11.1 - 15.9 g/dL 12.9 12.9  Hematocrit 34.0 - 46.6 % 37.4 37.8  Platelets 150 - 450 x10E3/uL 181 166    CMP Latest Ref Rng & Units 09/20/2018 01/18/2017  Glucose 65 - 99 mg/dL 81 84  BUN 8 - 27 mg/dL 14 17  Creatinine 0.57 - 1.00 mg/dL 1.01(H) 0.88  Sodium 134 - 144 mmol/L 140 140  Potassium 3.5 - 5.2 mmol/L 3.6 4.0  Chloride 96 - 106 mmol/L 100 102  CO2 20 - 29 mmol/L 25 31  Calcium 8.7 - 10.3 mg/dL 10.0 9.9  Total Protein 6.0 - 8.5 g/dL 7.2 7.1  Total Bilirubin 0.0 - 1.2 mg/dL 0.8 1.3(H)  Alkaline Phos 39 - 117 IU/L 49 -  AST 0 - 40 IU/L 22 24  ALT 0 - 32 IU/L 20 20    Lipid Panel     Component Value Date/Time   CHOL 186 01/18/2017 1257   TRIG 49 01/18/2017 1257   HDL 128 01/18/2017 1257   CHOLHDL 1.5 01/18/2017 1257   LDLCALC 44 01/18/2017 1257    No components found for: NTPROBNP No results for input(s): PROBNP in the last 8760 hours. No results for input(s): TSH in the last 8760 hours.  BMP No results for input(s): NA, K, CL, CO2, GLUCOSE, BUN, CREATININE, CALCIUM, GFRNONAA, GFRAA in the last 8760 hours.  HEMOGLOBIN A1C Lab Results  Component Value Date   HGBA1C 5.6% 05/22/2017   External Labs: Collected: 08/14/2019 Creatinine 1.02 mg/dL. eGFR: 63 mL/min per 1.73 m TSH: 0.81   IMPRESSION:    ICD-10-CM   1. Sinus bradycardia  R00.1   2. Encounter to discuss test results  Z71.2   3. Benign hypertension  I10      RECOMMENDATIONS: Admire Bunnell is a 79 y.o. female whose past medical history and cardiac risk factors include: Sinus bradycardia, hypertension, postmenopausal female, advanced age.  Sinus bradycardia:  Patient was  referred to the office for evaluation of sinus bradycardia.  Clinically patient is asymptomatic but did undergo an extensive cardiovascular evaluation.  No reversible causes identified.  TSH levels within normal limits and independently reviewed outside blood work from her PCPs office.  24-hour Holter monitor did not show any significant dysrhythmias.  Patient has a good ventricular rate range and her average heart rate is 61 bpm.  Exercise nuclear stress test showed normal myocardial perfusion overall low risk study.  In addition, patient does have good chronotropic competence as she was able to achieve greater than 85% of age predicted maximum heart rate.  Echocardiogram notes a preserved left ventricular systolic function without any significant valvular heart disease.  No additional cardiovascular testing needed at this time.  Would recommend monitoring for symptom management.  Benign essential hypertension: Currently managed by primary team.  Right carotid bruit: Reviewed the results of the carotid duplex which did not show any significant atherosclerotic burden.  Results noted above for further reference.  FINAL MEDICATION  LIST END OF ENCOUNTER: No orders of the defined types were placed in this encounter.   Current Outpatient Medications:  .  aspirin EC 81 MG tablet, Take 81 mg by mouth daily., Disp: , Rfl:  .  Boswellia-Glucosamine-Vit D (GLUCOSAMINE COMPLEX PO), Take 1 tablet by mouth daily., Disp: , Rfl:  .  losartan-hydrochlorothiazide (HYZAAR) 50-12.5 MG tablet, Take 1 tablet by mouth daily., Disp: , Rfl:  .  Multiple Vitamin (MULTIVITAMIN) tablet, Take 1 tablet by mouth daily., Disp: , Rfl:  .  Omega-3 Fatty Acids (FISH OIL OMEGA-3 PO), Take 1 capsule by mouth., Disp: , Rfl:   No orders of the defined types were placed in this encounter.  Total time spent: 35 minutes.  --Continue cardiac medications as reconciled in final medication list. --Return in about 1 year  (around 10/02/2020) for follow for primary prevention and hx of bradycardia. . Or sooner if needed. --Continue follow-up with your primary care physician regarding the management of your other chronic comorbid conditions.  Patient's questions and concerns were addressed to her satisfaction. She voices understanding of the instructions provided during this encounter.   This note was created using a voice recognition software as a result there may be grammatical errors inadvertently enclosed that do not reflect the nature of this encounter. Every attempt is made to correct such errors.  Rex Kras, [place], [facility]  Pager: [phone] Office: [phone]

## 2019-10-16 DIAGNOSIS — I10 Essential (primary) hypertension: Secondary | ICD-10-CM | POA: Diagnosis not present

## 2019-10-16 DIAGNOSIS — R001 Bradycardia, unspecified: Secondary | ICD-10-CM | POA: Diagnosis not present

## 2019-10-16 DIAGNOSIS — R5383 Other fatigue: Secondary | ICD-10-CM | POA: Diagnosis not present

## 2019-10-16 DIAGNOSIS — Z8719 Personal history of other diseases of the digestive system: Secondary | ICD-10-CM | POA: Diagnosis not present

## 2019-10-16 DIAGNOSIS — I6521 Occlusion and stenosis of right carotid artery: Secondary | ICD-10-CM | POA: Diagnosis not present

## 2019-10-16 DIAGNOSIS — D72819 Decreased white blood cell count, unspecified: Secondary | ICD-10-CM | POA: Diagnosis not present

## 2019-10-16 DIAGNOSIS — M8589 Other specified disorders of bone density and structure, multiple sites: Secondary | ICD-10-CM | POA: Diagnosis not present

## 2019-10-16 DIAGNOSIS — I341 Nonrheumatic mitral (valve) prolapse: Secondary | ICD-10-CM | POA: Diagnosis not present

## 2019-12-03 DIAGNOSIS — Z23 Encounter for immunization: Secondary | ICD-10-CM | POA: Diagnosis not present

## 2020-01-06 DIAGNOSIS — Z23 Encounter for immunization: Secondary | ICD-10-CM | POA: Diagnosis not present

## 2020-01-15 DIAGNOSIS — Z8719 Personal history of other diseases of the digestive system: Secondary | ICD-10-CM | POA: Diagnosis not present

## 2020-01-15 DIAGNOSIS — I341 Nonrheumatic mitral (valve) prolapse: Secondary | ICD-10-CM | POA: Diagnosis not present

## 2020-01-15 DIAGNOSIS — I6521 Occlusion and stenosis of right carotid artery: Secondary | ICD-10-CM | POA: Diagnosis not present

## 2020-01-15 DIAGNOSIS — R001 Bradycardia, unspecified: Secondary | ICD-10-CM | POA: Diagnosis not present

## 2020-01-15 DIAGNOSIS — R5383 Other fatigue: Secondary | ICD-10-CM | POA: Diagnosis not present

## 2020-01-15 DIAGNOSIS — D72819 Decreased white blood cell count, unspecified: Secondary | ICD-10-CM | POA: Diagnosis not present

## 2020-01-15 DIAGNOSIS — M8589 Other specified disorders of bone density and structure, multiple sites: Secondary | ICD-10-CM | POA: Diagnosis not present

## 2020-01-15 DIAGNOSIS — I10 Essential (primary) hypertension: Secondary | ICD-10-CM | POA: Diagnosis not present

## 2020-01-30 DIAGNOSIS — H6982 Other specified disorders of Eustachian tube, left ear: Secondary | ICD-10-CM | POA: Diagnosis not present

## 2020-01-30 DIAGNOSIS — H6522 Chronic serous otitis media, left ear: Secondary | ICD-10-CM | POA: Diagnosis not present

## 2020-02-28 ENCOUNTER — Other Ambulatory Visit (HOSPITAL_COMMUNITY): Payer: Self-pay | Admitting: Family Medicine

## 2020-02-28 DIAGNOSIS — Z1231 Encounter for screening mammogram for malignant neoplasm of breast: Secondary | ICD-10-CM

## 2020-02-29 DIAGNOSIS — H838X3 Other specified diseases of inner ear, bilateral: Secondary | ICD-10-CM | POA: Diagnosis not present

## 2020-02-29 DIAGNOSIS — H6982 Other specified disorders of Eustachian tube, left ear: Secondary | ICD-10-CM | POA: Diagnosis not present

## 2020-02-29 DIAGNOSIS — H7202 Central perforation of tympanic membrane, left ear: Secondary | ICD-10-CM | POA: Diagnosis not present

## 2020-02-29 DIAGNOSIS — H903 Sensorineural hearing loss, bilateral: Secondary | ICD-10-CM | POA: Diagnosis not present

## 2020-03-25 ENCOUNTER — Ambulatory Visit (HOSPITAL_COMMUNITY)
Admission: RE | Admit: 2020-03-25 | Discharge: 2020-03-25 | Disposition: A | Discharge status: Home / Self Care | Payer: Medicare Other | Source: Ambulatory Visit | Attending: Family Medicine | Admitting: Family Medicine

## 2020-03-25 ENCOUNTER — Other Ambulatory Visit: Payer: Self-pay

## 2020-03-25 DIAGNOSIS — Z1231 Encounter for screening mammogram for malignant neoplasm of breast: Secondary | ICD-10-CM | POA: Diagnosis not present

## 2020-04-16 DIAGNOSIS — I341 Nonrheumatic mitral (valve) prolapse: Secondary | ICD-10-CM | POA: Diagnosis not present

## 2020-04-16 DIAGNOSIS — Z8719 Personal history of other diseases of the digestive system: Secondary | ICD-10-CM | POA: Diagnosis not present

## 2020-04-16 DIAGNOSIS — R6889 Other general symptoms and signs: Secondary | ICD-10-CM | POA: Diagnosis not present

## 2020-04-16 DIAGNOSIS — M48061 Spinal stenosis, lumbar region without neurogenic claudication: Secondary | ICD-10-CM | POA: Diagnosis not present

## 2020-04-16 DIAGNOSIS — R001 Bradycardia, unspecified: Secondary | ICD-10-CM | POA: Diagnosis not present

## 2020-04-16 DIAGNOSIS — I6521 Occlusion and stenosis of right carotid artery: Secondary | ICD-10-CM | POA: Diagnosis not present

## 2020-04-16 DIAGNOSIS — M8589 Other specified disorders of bone density and structure, multiple sites: Secondary | ICD-10-CM | POA: Diagnosis not present

## 2020-04-16 DIAGNOSIS — D72819 Decreased white blood cell count, unspecified: Secondary | ICD-10-CM | POA: Diagnosis not present

## 2020-04-16 DIAGNOSIS — I10 Essential (primary) hypertension: Secondary | ICD-10-CM | POA: Diagnosis not present

## 2020-04-18 DIAGNOSIS — D72819 Decreased white blood cell count, unspecified: Secondary | ICD-10-CM | POA: Diagnosis not present

## 2020-04-18 DIAGNOSIS — R001 Bradycardia, unspecified: Secondary | ICD-10-CM | POA: Diagnosis not present

## 2020-04-18 DIAGNOSIS — I6521 Occlusion and stenosis of right carotid artery: Secondary | ICD-10-CM | POA: Diagnosis not present

## 2020-04-18 DIAGNOSIS — M48061 Spinal stenosis, lumbar region without neurogenic claudication: Secondary | ICD-10-CM | POA: Diagnosis not present

## 2020-04-18 DIAGNOSIS — M8589 Other specified disorders of bone density and structure, multiple sites: Secondary | ICD-10-CM | POA: Diagnosis not present

## 2020-04-18 DIAGNOSIS — Z8719 Personal history of other diseases of the digestive system: Secondary | ICD-10-CM | POA: Diagnosis not present

## 2020-04-18 DIAGNOSIS — R6889 Other general symptoms and signs: Secondary | ICD-10-CM | POA: Diagnosis not present

## 2020-04-18 DIAGNOSIS — I10 Essential (primary) hypertension: Secondary | ICD-10-CM | POA: Diagnosis not present

## 2020-04-18 DIAGNOSIS — I341 Nonrheumatic mitral (valve) prolapse: Secondary | ICD-10-CM | POA: Diagnosis not present

## 2020-05-10 ENCOUNTER — Other Ambulatory Visit: Payer: Self-pay

## 2020-05-10 ENCOUNTER — Encounter (HOSPITAL_COMMUNITY): Payer: Self-pay | Admitting: Hematology and Oncology

## 2020-05-10 ENCOUNTER — Inpatient Hospital Stay (HOSPITAL_COMMUNITY): Payer: Medicare Other

## 2020-05-10 ENCOUNTER — Inpatient Hospital Stay (HOSPITAL_COMMUNITY): Payer: Medicare Other | Attending: Hematology and Oncology | Admitting: Hematology and Oncology

## 2020-05-10 VITALS — BP 146/67 | HR 59 | Resp 16 | Ht 59.0 in | Wt 143.5 lb

## 2020-05-10 DIAGNOSIS — R922 Inconclusive mammogram: Secondary | ICD-10-CM | POA: Diagnosis not present

## 2020-05-10 DIAGNOSIS — D72819 Decreased white blood cell count, unspecified: Secondary | ICD-10-CM | POA: Insufficient documentation

## 2020-05-10 LAB — CBC WITH DIFFERENTIAL/PLATELET
Abs Immature Granulocytes: 0 10*3/uL (ref 0.00–0.07)
Basophils Absolute: 0 10*3/uL (ref 0.0–0.1)
Basophils Relative: 1 %
Eosinophils Absolute: 0.1 10*3/uL (ref 0.0–0.5)
Eosinophils Relative: 3 %
HCT: 36.1 % (ref 36.0–46.0)
Hemoglobin: 12.1 g/dL (ref 12.0–15.0)
Immature Granulocytes: 0 %
Lymphocytes Relative: 40 %
Lymphs Abs: 0.9 10*3/uL (ref 0.7–4.0)
MCH: 30.8 pg (ref 26.0–34.0)
MCHC: 33.5 g/dL (ref 30.0–36.0)
MCV: 91.9 fL (ref 80.0–100.0)
Monocytes Absolute: 0.3 10*3/uL (ref 0.1–1.0)
Monocytes Relative: 13 %
Neutro Abs: 0.9 10*3/uL — ABNORMAL LOW (ref 1.7–7.7)
Neutrophils Relative %: 43 %
Platelets: 154 10*3/uL (ref 150–400)
RBC: 3.93 MIL/uL (ref 3.87–5.11)
RDW: 12 % (ref 11.5–15.5)
WBC: 2.2 10*3/uL — ABNORMAL LOW (ref 4.0–10.5)
nRBC: 0 % (ref 0.0–0.2)

## 2020-05-10 LAB — VITAMIN B12: Vitamin B-12: 476 pg/mL (ref 180–914)

## 2020-05-10 NOTE — Progress Notes (Signed)
[place] NOTE  Patient Care Team: Vernie Shanks, MD as PCP - General (Family Medicine)  CHIEF COMPLAINTS/PURPOSE OF CONSULTATION:  Leukopenia  ASSESSMENT & PLAN:   Leucopenia Chronic leukopenia, Previously followed with hematology since 1999 She had BMB in 1999 which suggested dysplasia with some refractory anemia although her Hb was quite normal. She then used to take some neupogen prior to surgical procedures. She then saw a hematologist in [place] had another BMB which showed no evidence of dysplasia, normocellular marrow and normal cytogenetics. Following this BMB, she was thought to have benign neutropenia and discharged from clinic. She now is establishing with [place] since she is new to [place]. No concerning ROS PE unremarkable. 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 She would like to FU with [place] once a yr. She was encouraged to contact us with any new questions.  Dense breast tissue on mammogram Patient mentions that this has been evaluated with a repeat mammogram.  She most recently had another mammogram which did not indicate any presence of abnormal findings.  She continues with yearly mammogram.  I personally have not reviewed these results of the mammograms from outside as well as the most recent one.  Orders Placed This Encounter  Procedures  . CBC with Differential/Platelet    Standing Status:   Standing    Number of Occurrences:   22    Standing Expiration Date:   05/10/2021  . Pathologist smear review    Standing Status:   Future    Standing Expiration Date:   05/10/2021  . Vitamin B12    Standing Status:   Future    Standing Expiration Date:   05/10/2021  . Folate RBC    Standing Status:   Future    Standing Expiration Date:   05/10/2021    HISTORY OF  PRESENTING ILLNESS:  Wendy [name] 80 y.o. female is here because of neutropenia.  Patient is an excellent historian.  She arrived today to the appointment with her husband.  She has previously seen hematologist multiple times.  Back in 1999 when she saw her hematologist with leukopenia, she was thought to have some myelodysplasia, had a bone marrow biopsy which suggested some dysplasia as well as refractory anemia.  She however was completely asymptomatic, never had any health issues no recurrent infections.  She does admit that her cold takes a little bit longer to resolve.  She used to get Neupogen injections prior to surgical procedures given her neutropenia at that time.  She later followed up with another hematologist in [place] when a bone marrow biopsy was repeated in 2009.  This bone marrow biopsy results were completely normal, no evidence of dysplasia, normal cytogenetics.  Since the normal bone marrow biopsy, her hematologist told her that she most likely has pseudoneutropenia and there are no treatment recommendations.  She was discharged from hematology clinic and followed up with her PCP.  She most recently moved closer to her family in [place] and is establishing with hematology locally.  I reviewed labs from several years, her white blood cell count mostly ranged from 2500-3500 with moderate neutropenia.  She is very healthy, no B symptoms.  No changes in breathing, bowel habits or urinary habits.  No new neurological complaints.  Review of systems completely  unremarkable.  Family history noted sister had leukemia, maternal aunt had breast cancer in her 75s.  Patient is up-to-date with her age-appropriate cancer screening.  REVIEW OF SYSTEMS:   Constitutional: Denies fevers, chills or abnormal night sweats Eyes: Denies blurriness of vision, double vision or watery eyes Ears, nose, mouth, throat, and face: Denies mucositis or sore throat Respiratory: Denies cough, dyspnea or  wheezes Cardiovascular: Denies palpitation, chest discomfort or lower extremity swelling Gastrointestinal:  Denies nausea, heartburn or change in bowel habits Skin: Denies abnormal skin rashes Lymphatics: Denies new lymphadenopathy or easy bruising Neurological:Denies numbness, tingling or new weaknesses Behavioral/Psych: Mood is stable, no new changes  All other systems were reviewed with the patient and are negative.  MEDICAL HISTORY:  Past Medical History:  Diagnosis Date  . Arthritis    hands, neck lower back  . Cataract    extracted  . GERD (gastroesophageal reflux disease)    prior med  . Hypertension     SURGICAL HISTORY: Past Surgical History:  Procedure Laterality Date  . CATARACT EXTRACTION, BILATERAL    . SHOULDER ARTHROSCOPY    . SHOULDER ARTHROTOMY Left    about 2007    SOCIAL HISTORY: Social History   Socioeconomic History  . Marital status: Married    Spouse name: Gwyndolyn Saxon  . Number of children: 3  . Years of education: Not on file  . Highest education level: Associate degree: academic program  Occupational History  . 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 Smoking status: Never Smoker  . Smokeless tobacco: Never Used  Vaping Use  . Vaping Use: Never used  Substance and Sexual Activity  . Alcohol use: No  . Drug use: No  . Sexual activity: Not Currently  Other Topics Concern  . Not on file  Social History Narrative   Retired Therapist, sports   Lives with Husband Gwyndolyn Saxon   moved to [place] to be near children   Daughter is Esmond Harps   Social Determinants of Radio broadcast assistant Strain: Low Risk   . Difficulty of Paying Living Expenses: Not hard at all  Food Insecurity: No Food Insecurity  . Worried About Charity fundraiser in the Last Year: Never true  . Ran Out of Food in the Last Year: Never true  Transportation Needs: No Transportation Needs  . Lack of Transportation (Medical): No  . Lack of Transportation  (Non-Medical): No  Physical Activity: Insufficiently Active  . Days of Exercise per Week: 3 days  . Minutes of Exercise per Session: 10 min  Stress: No Stress Concern Present  . Feeling of Stress : Not at all  Social Connections: Socially Integrated  . Frequency of Communication with Friends and Family: More than three times a week  . Frequency of Social Gatherings with Friends and Family: More than three times a week  . Attends Religious Services: More than 4 times per year  . Active Member of Clubs or Organizations: Yes  . Attends Archivist Meetings: 1 to 4 times per year  . Marital Status: Married  Human resources officer Violence: Not At Risk  . Fear of Current or Ex-Partner: No  . Emotionally Abused: No  . Physically Abused: No  . Sexually Abused: No    FAMILY HISTORY: Family History  Problem Relation Age of Onset  . COPD Mother   . Arthritis Mother   . Hearing loss Mother   . Hypertension Mother   . Stroke  Father 78  . Diabetes Father   . Thyroid disease Daughter   . Diabetes Son   . Cancer Brother        prostate  . Mental illness Brother        PTSD from war  . Heart disease Sister   . Hyperlipidemia Sister   . Hypertension Sister   . Cancer Sister        Leukemia  . Cancer Maternal Aunt        breast    ALLERGIES:  has No Known Allergies.  MEDICATIONS:  Current Outpatient Medications  Medication Sig Dispense Refill  . Ascorbic Acid (VITAMIN C) 500 MG CAPS See admin instructions.    Marland Kitchen aspirin EC 81 MG tablet Take 81 mg by mouth daily.    . Boswellia-Glucosamine-Vit D (GLUCOSAMINE COMPLEX PO) Take 1 tablet by mouth daily.    . Glucosamine 500 MG CAPS 1 capsule with a meal    . losartan-hydrochlorothiazide (HYZAAR) 50-12.5 MG tablet Take 1 tablet by mouth daily.    . Multiple Vitamin (MULTIVITAMIN) tablet Take 1 tablet by mouth daily.    . Omega-3 Fatty Acids (FISH OIL OMEGA-3 PO) Take 1 capsule by mouth.     No current facility-administered  medications for this visit.    PHYSICAL EXAMINATION: ECOG PERFORMANCE STATUS: 0 - Asymptomatic  Vitals:   05/10/20 0940  BP: (!) 146/67  Pulse: (!) 59  Resp: 16  SpO2: 96%   Filed Weights   05/10/20 0940  Weight: 143 lb 8 oz (65.1 kg)    GENERAL:alert, no distress and comfortable SKIN: skin color, texture, turgor are normal, no rashes or significant lesions EYES: normal, conjunctiva are pink and non-injected, sclera clear OROPHARYNX:no exudate, no erythema and lips, buccal mucosa, and tongue normal  NECK: supple, thyroid normal size, non-tender, without nodularity LYMPH:  no palpable lymphadenopathy in the cervical, axillary or inguinal LUNGS: clear to auscultation and percussion with normal breathing effort HEART: regular rate & rhythm and no murmurs and no lower extremity edema ABDOMEN:abdomen soft, non-tender and normal bowel sounds Musculoskeletal:no cyanosis of digits and no clubbing  PSYCH: alert & oriented x 3 with fluent speech NEURO: no focal motor/sensory deficits  LABORATORY DATA:  I have reviewed the data as listed Lab Results  Component Value Date   WBC 3.2 (L) 09/20/2018   HGB 12.9 09/20/2018   HCT 37.4 09/20/2018   MCV 88 09/20/2018   PLT 181 09/20/2018     Chemistry      Component Value Date/Time   NA 140 09/20/2018 1421   K 3.6 09/20/2018 1421   CL 100 09/20/2018 1421   CO2 25 09/20/2018 1421   BUN 14 09/20/2018 1421   CREATININE 1.01 (H) 09/20/2018 1421   CREATININE 0.88 01/18/2017 1257      Component Value Date/Time   CALCIUM 10.0 09/20/2018 1421   ALKPHOS 49 09/20/2018 1421   AST 22 09/20/2018 1421   ALT 20 09/20/2018 1421   BILITOT 0.8 09/20/2018 1421       RADIOGRAPHIC STUDIES: I have personally reviewed the radiological images as listed and agreed with the findings in the report. No results found.  All questions were answered. The patient knows to call the clinic with any problems, questions or concerns. I spent 45 minutes in  the care of this patient including H and P, review of records, counseling and coordination of care.     Benay Pike, MD 05/10/2020 10:18 AM

## 2020-05-10 NOTE — Assessment & Plan Note (Signed)
Chronic leukopenia, Previously followed with hematology since 1999 She had BMB in 1999 which suggested dysplasia with some refractory anemia although her Hb was quite normal. She then used to take some neupogen prior to surgical procedures. She then saw a hematologist in [place] had another BMB which showed no evidence of dysplasia, normocellular marrow and normal cytogenetics. Following this BMB, she was thought to have benign neutropenia and discharged from clinic. She now is establishing with [place] since she is new to [place]. No concerning ROS PE unremarkable. 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 She would like to FU with [place] once a yr. She was encouraged to contact us with any new questions.

## 2020-05-10 NOTE — Assessment & Plan Note (Signed)
Patient mentions that this has been evaluated with a repeat mammogram.  She most recently had another mammogram which did not indicate any presence of abnormal findings.  She continues with yearly mammogram.  I personally have not reviewed these results of the mammograms from outside as well as the most recent one.

## 2020-05-12 LAB — FOLATE RBC
Folate, Hemolysate: 359 ng/mL
Folate, RBC: 984 ng/mL (ref >498)
Hematocrit: 36.5 % (ref 34.0–46.6)

## 2020-05-13 LAB — PATHOLOGIST SMEAR REVIEW

## 2020-05-14 DIAGNOSIS — M79671 Pain in right foot: Secondary | ICD-10-CM | POA: Diagnosis not present

## 2020-05-28 DIAGNOSIS — H2513 Age-related nuclear cataract, bilateral: Secondary | ICD-10-CM | POA: Diagnosis not present

## 2020-05-28 DIAGNOSIS — Z01419 Encounter for gynecological examination (general) (routine) without abnormal findings: Secondary | ICD-10-CM | POA: Diagnosis not present

## 2020-06-26 DIAGNOSIS — W57XXXA Bitten or stung by nonvenomous insect and other nonvenomous arthropods, initial encounter: Secondary | ICD-10-CM | POA: Diagnosis not present

## 2020-06-26 DIAGNOSIS — S30861A Insect bite (nonvenomous) of abdominal wall, initial encounter: Secondary | ICD-10-CM | POA: Diagnosis not present

## 2020-08-06 DIAGNOSIS — Z20822 Contact with and (suspected) exposure to covid-19: Secondary | ICD-10-CM | POA: Diagnosis not present

## 2020-09-04 DIAGNOSIS — U071 COVID-19: Secondary | ICD-10-CM | POA: Diagnosis not present

## 2020-09-04 DIAGNOSIS — Z9189 Other specified personal risk factors, not elsewhere classified: Secondary | ICD-10-CM | POA: Diagnosis not present

## 2020-09-04 DIAGNOSIS — Z20822 Contact with and (suspected) exposure to covid-19: Secondary | ICD-10-CM | POA: Diagnosis not present

## 2020-10-02 ENCOUNTER — Ambulatory Visit: Payer: Medicare Other | Admitting: Cardiology

## 2020-10-02 DIAGNOSIS — R001 Bradycardia, unspecified: Secondary | ICD-10-CM

## 2020-10-02 DIAGNOSIS — I10 Essential (primary) hypertension: Secondary | ICD-10-CM

## 2020-10-04 DIAGNOSIS — D72818 Other decreased white blood cell count: Secondary | ICD-10-CM | POA: Diagnosis not present

## 2020-10-04 DIAGNOSIS — I10 Essential (primary) hypertension: Secondary | ICD-10-CM | POA: Diagnosis not present

## 2020-10-04 DIAGNOSIS — Z789 Other specified health status: Secondary | ICD-10-CM | POA: Diagnosis not present

## 2020-10-04 DIAGNOSIS — R0989 Other specified symptoms and signs involving the circulatory and respiratory systems: Secondary | ICD-10-CM | POA: Diagnosis not present

## 2020-10-14 DIAGNOSIS — E559 Vitamin D deficiency, unspecified: Secondary | ICD-10-CM | POA: Diagnosis not present

## 2020-10-14 DIAGNOSIS — I10 Essential (primary) hypertension: Secondary | ICD-10-CM | POA: Diagnosis not present

## 2020-10-14 DIAGNOSIS — M8589 Other specified disorders of bone density and structure, multiple sites: Secondary | ICD-10-CM | POA: Diagnosis not present

## 2020-10-14 DIAGNOSIS — R001 Bradycardia, unspecified: Secondary | ICD-10-CM | POA: Diagnosis not present

## 2020-10-14 DIAGNOSIS — I341 Nonrheumatic mitral (valve) prolapse: Secondary | ICD-10-CM | POA: Diagnosis not present

## 2020-10-14 DIAGNOSIS — Z8719 Personal history of other diseases of the digestive system: Secondary | ICD-10-CM | POA: Diagnosis not present

## 2020-10-14 DIAGNOSIS — M48061 Spinal stenosis, lumbar region without neurogenic claudication: Secondary | ICD-10-CM | POA: Diagnosis not present

## 2020-10-14 DIAGNOSIS — R6889 Other general symptoms and signs: Secondary | ICD-10-CM | POA: Diagnosis not present

## 2020-10-14 DIAGNOSIS — D72819 Decreased white blood cell count, unspecified: Secondary | ICD-10-CM | POA: Diagnosis not present

## 2020-10-14 DIAGNOSIS — I6521 Occlusion and stenosis of right carotid artery: Secondary | ICD-10-CM | POA: Diagnosis not present

## 2020-10-15 ENCOUNTER — Other Ambulatory Visit: Payer: Self-pay

## 2020-10-15 ENCOUNTER — Encounter: Payer: Self-pay | Admitting: Cardiology

## 2020-10-15 ENCOUNTER — Ambulatory Visit: Payer: Medicare Other | Admitting: Cardiology

## 2020-10-15 VITALS — BP 135/68 | HR 51 | Temp 97.1°F | Resp 16 | Ht 59.0 in | Wt 142.0 lb

## 2020-10-15 DIAGNOSIS — R6889 Other general symptoms and signs: Secondary | ICD-10-CM | POA: Diagnosis not present

## 2020-10-15 DIAGNOSIS — Z712 Person consulting for explanation of examination or test findings: Secondary | ICD-10-CM

## 2020-10-15 DIAGNOSIS — I6521 Occlusion and stenosis of right carotid artery: Secondary | ICD-10-CM | POA: Diagnosis not present

## 2020-10-15 DIAGNOSIS — I10 Essential (primary) hypertension: Secondary | ICD-10-CM | POA: Diagnosis not present

## 2020-10-15 DIAGNOSIS — M48061 Spinal stenosis, lumbar region without neurogenic claudication: Secondary | ICD-10-CM | POA: Diagnosis not present

## 2020-10-15 DIAGNOSIS — E559 Vitamin D deficiency, unspecified: Secondary | ICD-10-CM | POA: Diagnosis not present

## 2020-10-15 DIAGNOSIS — D72819 Decreased white blood cell count, unspecified: Secondary | ICD-10-CM | POA: Diagnosis not present

## 2020-10-15 DIAGNOSIS — Z8719 Personal history of other diseases of the digestive system: Secondary | ICD-10-CM | POA: Diagnosis not present

## 2020-10-15 DIAGNOSIS — R001 Bradycardia, unspecified: Secondary | ICD-10-CM | POA: Diagnosis not present

## 2020-10-15 DIAGNOSIS — M8589 Other specified disorders of bone density and structure, multiple sites: Secondary | ICD-10-CM | POA: Diagnosis not present

## 2020-10-15 DIAGNOSIS — I341 Nonrheumatic mitral (valve) prolapse: Secondary | ICD-10-CM | POA: Diagnosis not present

## 2020-10-15 NOTE — Progress Notes (Signed)
ID:  Wendy [name], DOB [birth year], MRN [id number]  PCP:  Vernie Shanks, MD  Cardiologist:  Rex Kras, DO, [facility] (established care 09/05/2019)  Date: 10/15/20 Last Office Visit: 10/03/2019  Chief Complaint  Patient presents with   Hypertension   Bradycardia   Follow-up    1 year    HPI  Wendy [name] is a 80 y.o. female who presents to the office with a chief complaint of " 1 year follow-up for blood pressure and bradycardia." Patient's past medical history and cardiovascular risk factors include: Hx of COVID 19 (July 2022), Sinus bradycardia, hypertension, postmenopausal female, advanced age.  Patient is accompanied by her husband at today's office visit.  She moved from [place] to [place] and was referred to cardiology for evaluation of bradycardia and generalized fatigue.  During the last office visits she has undergone an ischemic evaluation which is outlined below and reviewed as part of today's office visit.  She also underwent a Holter monitor which noted an average heart rate of 61 bpm exercise nuclear stress test which noted good chronotropic competence.  At the last office visit she was asked to follow-up on an annual basis or sooner if needed.  She is here for her 1 year follow-up visit.  Over the last 1 year she is doing well from a cardiovascular standpoint.  No hospitalizations or urgent care visits.  She had her yearly physical earlier this morning and will have labs available tomorrow.  She is asked to forward a copy to [place] for further reference.  FUNCTIONAL STATUS: No structured exercise program or daily routine.  But she does work part-time and takes care of her household, families and chores.  ALLERGIES: No Known Allergies  MEDICATION LIST PRIOR TO VISIT: Current Meds  Medication Sig   Ascorbic Acid (VITAMIN C) 500 MG CAPS See admin instructions.   aspirin EC 81 MG tablet Take 81 mg by mouth daily.   Boswellia-Glucosamine-Vit D (GLUCOSAMINE COMPLEX PO)  Take 1 tablet by mouth daily.   Glucosamine 500 MG CAPS 1 capsule with a meal   losartan-hydrochlorothiazide (HYZAAR) 50-12.5 MG tablet Take 1 tablet by mouth daily.   Multiple Vitamin (MULTIVITAMIN) tablet Take 1 tablet by mouth daily.   Omega-3 Fatty Acids (FISH OIL OMEGA-3 PO) Take 1 capsule by mouth.     PAST MEDICAL HISTORY: Past Medical History:  Diagnosis Date   Arthritis    hands, neck lower back   Cataract    extracted   GERD (gastroesophageal reflux disease)    prior med   Hypertension     PAST SURGICAL HISTORY: Past Surgical History:  Procedure Laterality Date   CATARACT EXTRACTION, BILATERAL     SHOULDER ARTHROSCOPY     SHOULDER ARTHROTOMY Left    about 2007    FAMILY HISTORY: The patient family history includes Arthritis in her mother; COPD in her mother; Cancer in her brother, maternal aunt, and sister; Diabetes in her father and son; Hearing loss in her mother; Heart disease in her sister; Hyperlipidemia in her sister; Hypertension in her mother and sister; Mental illness in her brother; Stroke (age of onset: 24) in her father; Thyroid disease in her daughter.  SOCIAL HISTORY:  The patient  reports that she has never smoked. She has never used smokeless tobacco. She reports that she does not drink alcohol and does not use drugs.  REVIEW OF SYSTEMS: Review of Systems  Constitutional: Negative for chills and fever.  HENT:  Negative for hoarse voice and  nosebleeds.   Eyes:  Negative for discharge, double vision and pain.  Cardiovascular:  Negative for chest pain, claudication, dyspnea on exertion, leg swelling, near-syncope, orthopnea, palpitations, paroxysmal nocturnal dyspnea and syncope.  Respiratory:  Negative for hemoptysis and shortness of breath.   Musculoskeletal:  Negative for muscle cramps and myalgias.  Gastrointestinal:  Negative for abdominal pain, constipation, diarrhea, heartburn, hematemesis, hematochezia, melena, nausea and vomiting.   Neurological:  Negative for dizziness and light-headedness.   PHYSICAL EXAM: Vitals with BMI 10/15/2020 10/15/2020 05/10/2020  Height - _0  _1   Weight - 142 lbs 143 lbs 8 oz  BMI - 16.10 96.04  Systolic 540 981 191  Diastolic 68 60 67  Pulse 51 57 59   CONSTITUTIONAL: Well-developed and well-nourished. No acute distress.  SKIN: Skin is warm and dry. No rash noted. No cyanosis. No pallor. No jaundice HEAD: Normocephalic and atraumatic.  EYES: No scleral icterus MOUTH/THROAT: Moist oral membranes.  NECK: No JVD present. No thyromegaly noted. Right carotid bruits  LYMPHATIC: No visible cervical adenopathy.  CHEST Normal respiratory effort. No intercostal retractions  LUNGS: Clear to auscultation bilaterally. No stridor. No wheezes. No rales.  CARDIOVASCULAR: Regular rate and rhythm, positive S1-S2, no murmurs rubs or gallops appreciated. ABDOMINAL: Soft, nontender, nondistended, positive bowel sounds in all 4 quadrants, no apparent ascites.  EXTREMITIES: No peripheral edema, bilateral +2 PT and +1 DP.  HEMATOLOGIC: No significant bruising NEUROLOGIC: Oriented to person, place, and time. Nonfocal. Normal muscle tone.  PSYCHIATRIC: Normal mood and affect. Normal behavior. Cooperative  CARDIAC DATABASE: EKG: 10/15/2020: Sinus bradycardia, 52 bpm, LAE, old anteroseptal infarct, nonspecific ST-T changes.  No significant change compared to prior ECG.  Echocardiogram: 09/08/2019: Normal LV systolic function with visual EF 60-65%. Left ventricle cavity is normal in size. Normal global wall motion. Normal diastolic filling pattern, normal LAP. Calculated EF 65%. Mild (Grade I) mitral regurgitation. Mild tricuspid regurgitation. No prior study for comparison.  Stress Testing: Exercise Myoview stress test 09/25/2019: Exercise nuclear stress test was performed using Bruce protocol. Patient reached 7.7 METS, and 91% of age predicted maximum heart rate. Exercise capacity was fair. Chest  pain not reported. Normal heart rate and hemodynamic response.  Peak EKG/ECG demonstrated sinus tachycardia. occasional PAC's, 1 mm horizontal ST depression present in leads II, III, aVF, V4-V6. EKG changes normalized within 1 min into recovery. Given norma myocardial perfusion, EKG changes are likely false positive.  Normal myocardial perfusion. Stress LVEF 69%. Low risk study.  Heart Catheterization: None  Carotid artery duplex  47/82/9562: Peak systolic velocities in the right bifurcation, internal, external and common carotid arteries are within normal limits. Minimal stenosis in the left internal carotid artery (minimal) with homogeneous plaque. Antegrade right vertebral artery flow. Antegrade left vertebral artery flow.  24 hour Holter monitor: Dominant rhythm normal sinus. Heart rate 42-154 bpm.  Average heart rate 61 bpm. Minimum heart rate of 42 bpm occurred at 4:10am on 09/09/2019.  No atrial fibrillation/ventricular tachycardia/high grade AV block, sinus pause greater than or equal to 3 seconds in duration. Total ventricular ectopic burden <1%. Total supraventricular ectopic burden <1%. One auto detected episode of supraventricular tachycardia was 11 beats in duration at a maximum rate of 154 bpm. Number of patient triggered events: 2.  Underlying rhythm is normal sinus without any significant dysrhythmias.  LABORATORY DATA: CBC Latest Ref Rng & Units 05/10/2020 05/10/2020 09/20/2018  WBC 4.0 - 10.5 K/uL 2.2(L) - 3.2(L)  Hemoglobin 12.0 - 15.0 g/dL 12.1 - 12.9  Hematocrit 34.0 -  46.6 % 36.1 36.5 37.4  Platelets 150 - 400 K/uL 154 - 181    CMP Latest Ref Rng & Units 09/20/2018 01/18/2017  Glucose 65 - 99 mg/dL 81 84  BUN 8 - 27 mg/dL 14 17  Creatinine 0.57 - 1.00 mg/dL 1.01(H) 0.88  Sodium 134 - 144 mmol/L 140 140  Potassium 3.5 - 5.2 mmol/L 3.6 4.0  Chloride 96 - 106 mmol/L 100 102  CO2 20 - 29 mmol/L 25 31  Calcium 8.7 - 10.3 mg/dL 10.0 9.9  Total Protein 6.0 - 8.5 g/dL  7.2 7.1  Total Bilirubin 0.0 - 1.2 mg/dL 0.8 1.3(H)  Alkaline Phos 39 - 117 IU/L 49 -  AST 0 - 40 IU/L 22 24  ALT 0 - 32 IU/L 20 20    Lipid Panel     Component Value Date/Time   CHOL 186 01/18/2017 1257   TRIG 49 01/18/2017 1257   HDL 128 01/18/2017 1257   CHOLHDL 1.5 01/18/2017 1257   LDLCALC 44 01/18/2017 1257    No components found for: NTPROBNP No results for input(s): PROBNP in the last 8760 hours. No results for input(s): TSH in the last 8760 hours.  BMP No results for input(s): NA, K, CL, CO2, GLUCOSE, BUN, CREATININE, CALCIUM, GFRNONAA, GFRAA in the last 8760 hours.  HEMOGLOBIN A1C Lab Results  Component Value Date   HGBA1C 5.6% 05/22/2017   External Labs: Collected: 08/14/2019 Creatinine 1.02 mg/dL. eGFR: 63 mL/min per 1.73 m TSH: 0.81   IMPRESSION:    ICD-10-CM   1. Sinus bradycardia  R00.1 EKG 12-Lead    2. Encounter to discuss test results  Z71.2     3. Benign hypertension  I10        RECOMMENDATIONS: Wendy [name] is a 80 y.o. female whose past medical history and cardiac risk factors include: Hx of COVID 19 (July 2022), Sinus bradycardia, hypertension, postmenopausal female, advanced age.  Sinus bradycardia Asymptomatic. Noted to have good chronotropic competence during prior work-up No identifiable reversible causes Continue to monitor.  Benign hypertension Office blood pressures within acceptable range. Medications reconciled. Low-salt diet recommended.  Patient is noted to have mild valvular heart disease and her echocardiogram was performed in 2021.  No significant change in clinical status.  Patient is reassured and would recommend an echocardiogram in 3 to 5 years for follow-up.  From a cardiovascular standpoint she does not need to be on aspirin 81 mg p.o. daily as she has no prior history of CVA/Wendy, no known CAD, or significant atherosclerotic burden.  FINAL MEDICATION LIST END OF ENCOUNTER: No orders of the defined types were  placed in this encounter.   Current Outpatient Medications:    Ascorbic Acid (VITAMIN C) 500 MG CAPS, See admin instructions., Disp: , Rfl:    aspirin EC 81 MG tablet, Take 81 mg by mouth daily., Disp: , Rfl:    Boswellia-Glucosamine-Vit D (GLUCOSAMINE COMPLEX PO), Take 1 tablet by mouth daily., Disp: , Rfl:    Glucosamine 500 MG CAPS, 1 capsule with a meal, Disp: , Rfl:    losartan-hydrochlorothiazide (HYZAAR) 50-12.5 MG tablet, Take 1 tablet by mouth daily., Disp: , Rfl:    Multiple Vitamin (MULTIVITAMIN) tablet, Take 1 tablet by mouth daily., Disp: , Rfl:    Omega-3 Fatty Acids (FISH OIL OMEGA-3 PO), Take 1 capsule by mouth., Disp: , Rfl:    Cholecalciferol 50 MCG (2000 UT) CAPS, Take 1 capsule by mouth daily., Disp: , Rfl:   Orders Placed This Encounter  Procedures  EKG 12-Lead    Total time spent: 22 minutes.  --Continue cardiac medications as reconciled in final medication list. --Return in about 1 year (around 10/15/2021) for yearly follow up. Or sooner if needed. --Continue follow-up with your primary care physician regarding the management of your other chronic comorbid conditions.  Patient's questions and concerns were addressed to her satisfaction. She voices understanding of the instructions provided during this encounter.   This note was created using a voice recognition software as a result there may be grammatical errors inadvertently enclosed that do not reflect the nature of this encounter. Every attempt is made to correct such errors.  Rex Kras, [place], [facility]  Pager: [phone] Office: [phone]

## 2020-10-24 DIAGNOSIS — Z23 Encounter for immunization: Secondary | ICD-10-CM | POA: Diagnosis not present

## 2021-01-03 DIAGNOSIS — Z23 Encounter for immunization: Secondary | ICD-10-CM | POA: Diagnosis not present

## 2021-02-17 ENCOUNTER — Other Ambulatory Visit (HOSPITAL_COMMUNITY): Payer: Self-pay | Admitting: Family Medicine

## 2021-02-17 DIAGNOSIS — Z1231 Encounter for screening mammogram for malignant neoplasm of breast: Secondary | ICD-10-CM

## 2021-02-18 DIAGNOSIS — H6982 Other specified disorders of Eustachian tube, left ear: Secondary | ICD-10-CM | POA: Diagnosis not present

## 2021-02-18 DIAGNOSIS — H7202 Central perforation of tympanic membrane, left ear: Secondary | ICD-10-CM | POA: Diagnosis not present

## 2021-02-18 DIAGNOSIS — H9202 Otalgia, left ear: Secondary | ICD-10-CM | POA: Diagnosis not present

## 2021-03-03 DIAGNOSIS — H6122 Impacted cerumen, left ear: Secondary | ICD-10-CM | POA: Diagnosis not present

## 2021-03-03 DIAGNOSIS — H903 Sensorineural hearing loss, bilateral: Secondary | ICD-10-CM | POA: Diagnosis not present

## 2021-03-03 DIAGNOSIS — H838X3 Other specified diseases of inner ear, bilateral: Secondary | ICD-10-CM | POA: Diagnosis not present

## 2021-03-03 DIAGNOSIS — H9202 Otalgia, left ear: Secondary | ICD-10-CM | POA: Diagnosis not present

## 2021-03-26 ENCOUNTER — Other Ambulatory Visit: Payer: Self-pay

## 2021-03-26 ENCOUNTER — Ambulatory Visit (HOSPITAL_COMMUNITY)
Admission: RE | Admit: 2021-03-26 | Discharge: 2021-03-26 | Disposition: A | Discharge status: Home / Self Care | Payer: Medicare Other | Source: Ambulatory Visit | Attending: Family Medicine | Admitting: Family Medicine

## 2021-03-26 DIAGNOSIS — Z1231 Encounter for screening mammogram for malignant neoplasm of breast: Secondary | ICD-10-CM | POA: Diagnosis not present

## 2021-03-31 DIAGNOSIS — M549 Dorsalgia, unspecified: Secondary | ICD-10-CM | POA: Diagnosis not present

## 2021-04-15 DIAGNOSIS — I341 Nonrheumatic mitral (valve) prolapse: Secondary | ICD-10-CM | POA: Diagnosis not present

## 2021-04-15 DIAGNOSIS — M19039 Primary osteoarthritis, unspecified wrist: Secondary | ICD-10-CM | POA: Diagnosis not present

## 2021-04-15 DIAGNOSIS — M48061 Spinal stenosis, lumbar region without neurogenic claudication: Secondary | ICD-10-CM | POA: Diagnosis not present

## 2021-04-15 DIAGNOSIS — Z8719 Personal history of other diseases of the digestive system: Secondary | ICD-10-CM | POA: Diagnosis not present

## 2021-04-15 DIAGNOSIS — I6521 Occlusion and stenosis of right carotid artery: Secondary | ICD-10-CM | POA: Diagnosis not present

## 2021-04-15 DIAGNOSIS — E559 Vitamin D deficiency, unspecified: Secondary | ICD-10-CM | POA: Diagnosis not present

## 2021-04-15 DIAGNOSIS — R001 Bradycardia, unspecified: Secondary | ICD-10-CM | POA: Diagnosis not present

## 2021-04-15 DIAGNOSIS — D72819 Decreased white blood cell count, unspecified: Secondary | ICD-10-CM | POA: Diagnosis not present

## 2021-04-15 DIAGNOSIS — Z1211 Encounter for screening for malignant neoplasm of colon: Secondary | ICD-10-CM | POA: Diagnosis not present

## 2021-04-15 DIAGNOSIS — R0989 Other specified symptoms and signs involving the circulatory and respiratory systems: Secondary | ICD-10-CM | POA: Diagnosis not present

## 2021-04-15 DIAGNOSIS — M8589 Other specified disorders of bone density and structure, multiple sites: Secondary | ICD-10-CM | POA: Diagnosis not present

## 2021-04-15 DIAGNOSIS — I10 Essential (primary) hypertension: Secondary | ICD-10-CM | POA: Diagnosis not present

## 2021-04-30 DIAGNOSIS — A084 Viral intestinal infection, unspecified: Secondary | ICD-10-CM | POA: Diagnosis not present

## 2021-05-02 ENCOUNTER — Other Ambulatory Visit (HOSPITAL_COMMUNITY): Payer: Self-pay

## 2021-05-02 DIAGNOSIS — D72819 Decreased white blood cell count, unspecified: Secondary | ICD-10-CM

## 2021-05-03 IMAGING — MG DIGITAL SCREENING BILAT W/ TOMO W/ CAD
8 series · 9 of 24 positions shown · non-contrast
Comparison: Previous exam(s).

CLINICAL DATA: Screening.

EXAM:
DIGITAL SCREENING BILATERAL MAMMOGRAM WITH TOMO AND CAD

[L MLO synth-2D]
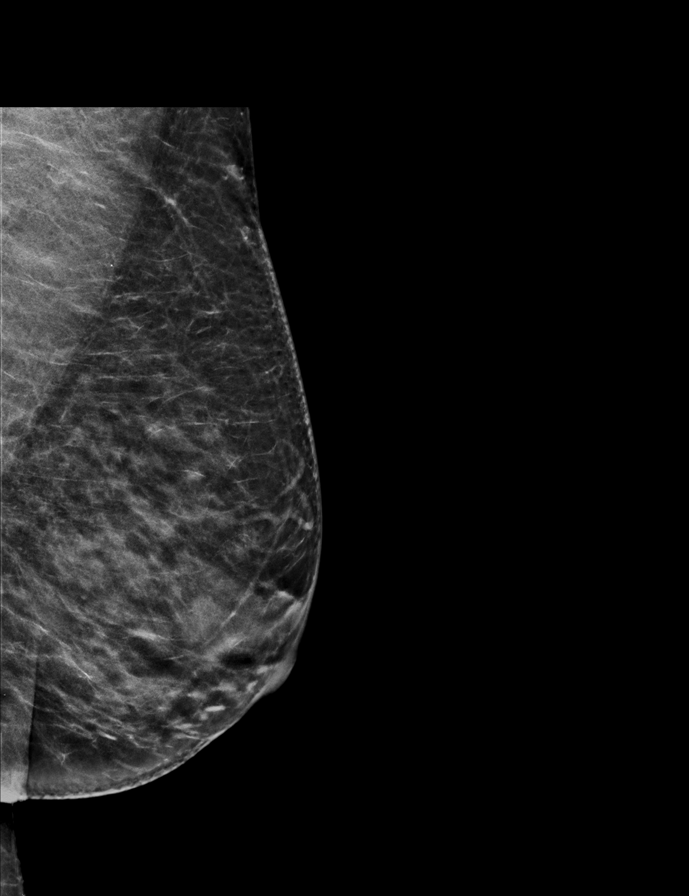

[R CC synth-2D]
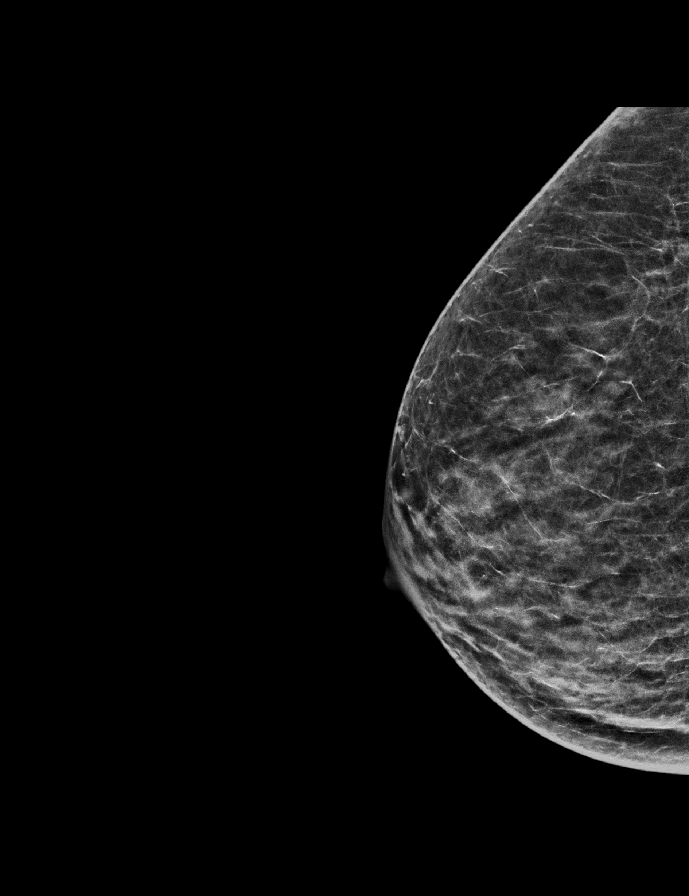

[R MLO synth-2D]
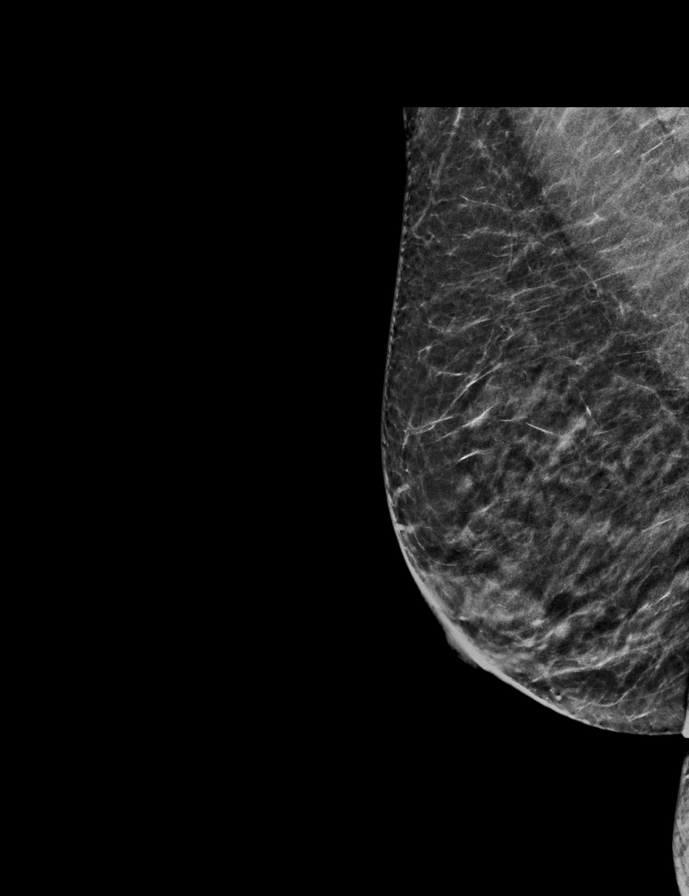

[L CC synth-2D]
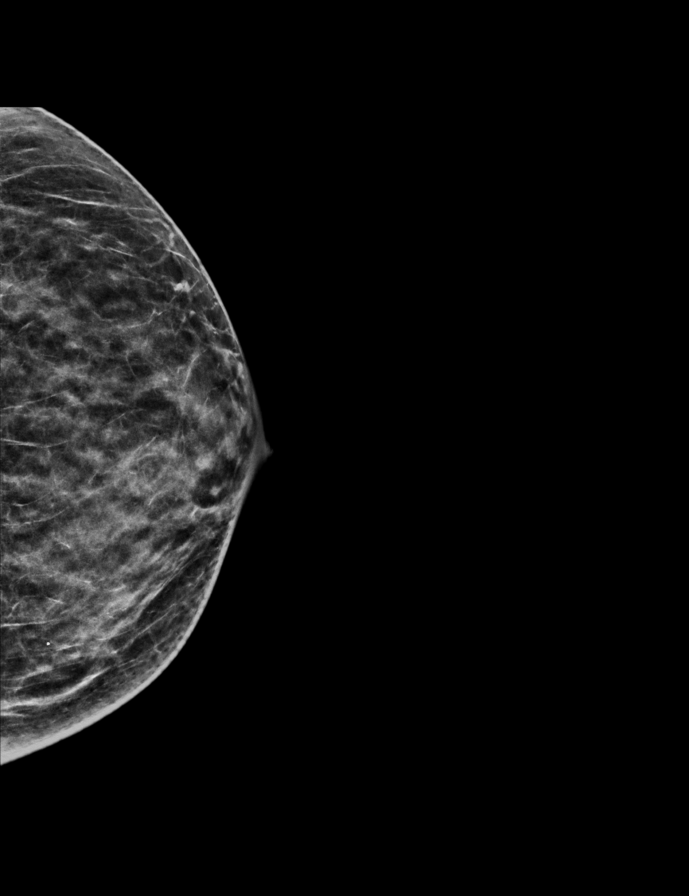

[R CC tomo · 2 of 52 frames shown]
[frame 17/52]
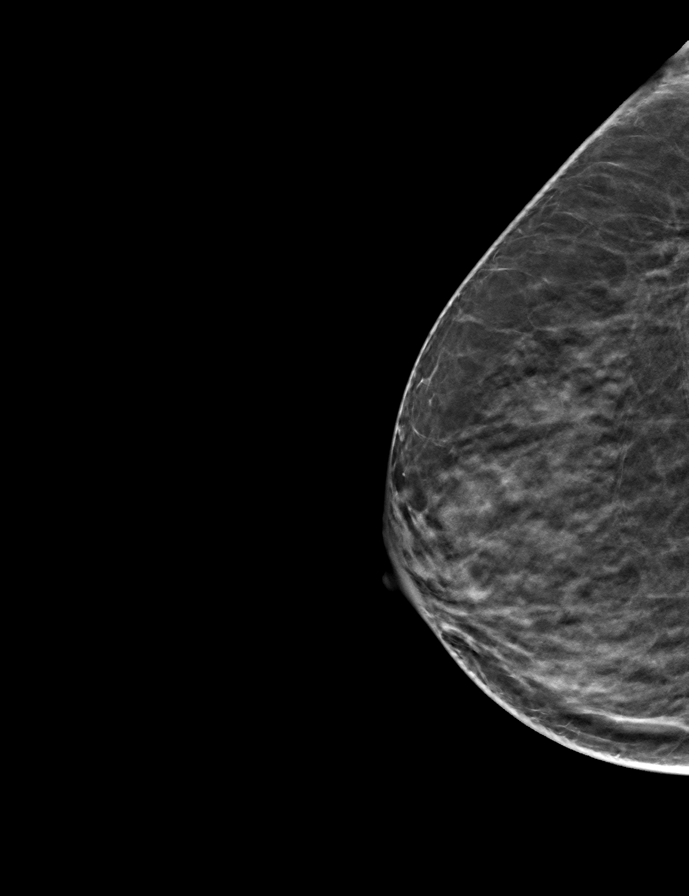
[frame 27/52]
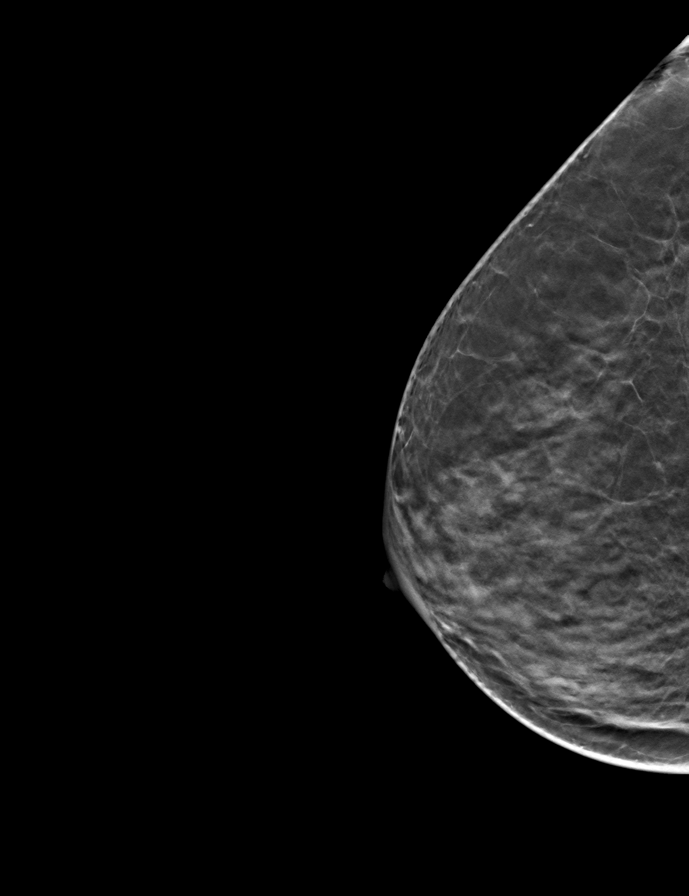

[R MLO tomo · tomo slice 27/52.0]
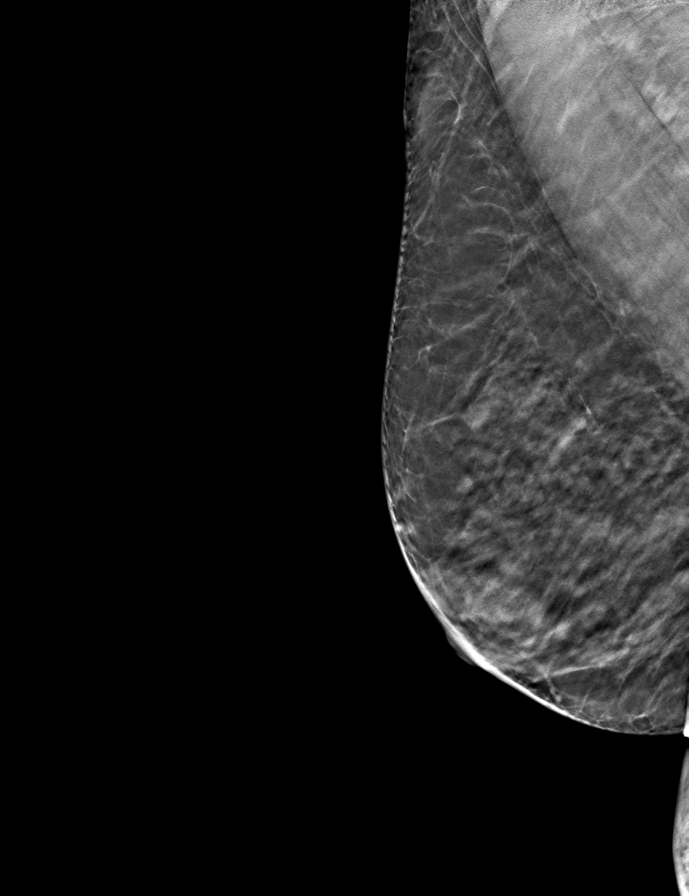

[L MLO tomo · tomo slice 30/59.0]
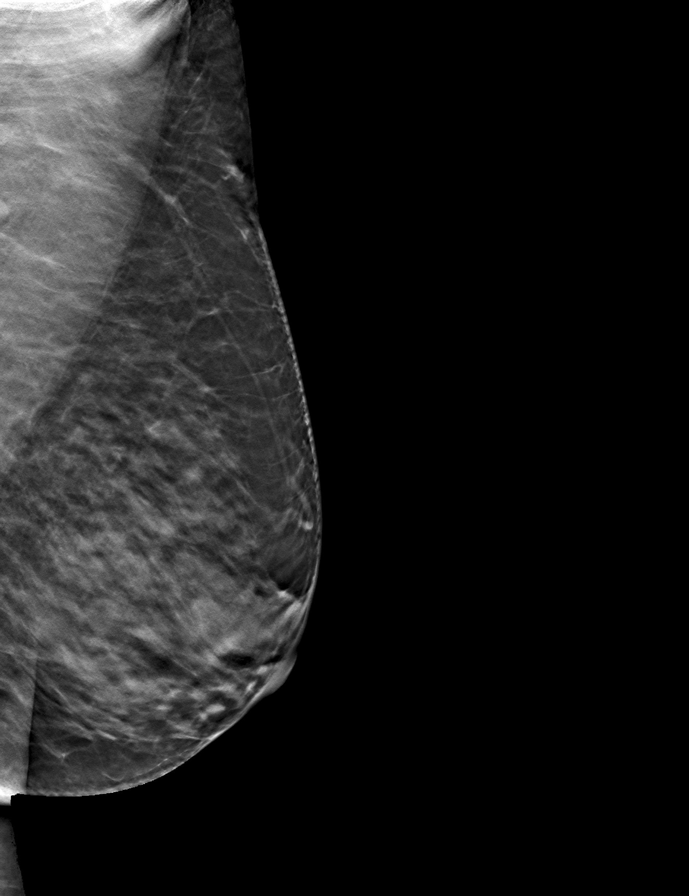

[L CC tomo · tomo slice 26/51.0]
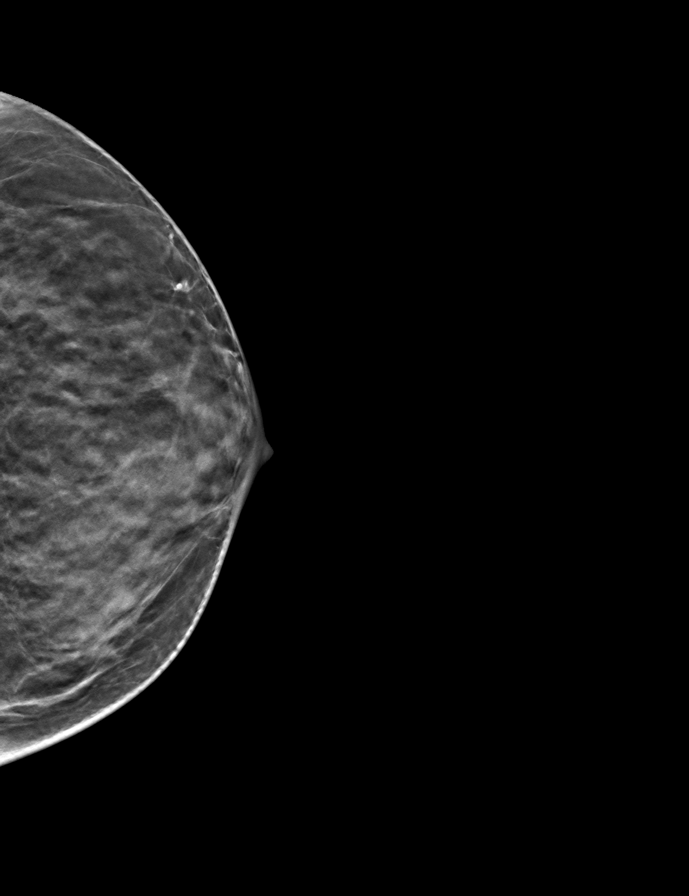

[9 of 24 positions shown; findings below may reference images not displayed]

ACR Breast Density Category c: The breast tissue is heterogeneously
dense, which may obscure small masses.
FINDINGS: There are no findings suspicious for malignancy. Images were
processed with CAD.
IMPRESSION: No mammographic evidence of malignancy. A result letter of this
screening mammogram will be mailed directly to the patient.

RECOMMENDATION:
Screening mammogram in one year. (Code:[id number])

BI-RADS CATEGORY  1: Negative.

## 2021-05-05 ENCOUNTER — Inpatient Hospital Stay (HOSPITAL_COMMUNITY): Payer: Medicare Other | Attending: Hematology

## 2021-05-05 DIAGNOSIS — Z7982 Long term (current) use of aspirin: Secondary | ICD-10-CM | POA: Diagnosis not present

## 2021-05-05 DIAGNOSIS — Z79899 Other long term (current) drug therapy: Secondary | ICD-10-CM | POA: Diagnosis not present

## 2021-05-05 DIAGNOSIS — D72819 Decreased white blood cell count, unspecified: Secondary | ICD-10-CM | POA: Diagnosis not present

## 2021-05-05 LAB — CBC WITH DIFFERENTIAL/PLATELET
Abs Immature Granulocytes: 0.01 10*3/uL (ref 0.00–0.07)
Basophils Absolute: 0 10*3/uL (ref 0.0–0.1)
Basophils Relative: 1 %
Eosinophils Absolute: 0.1 10*3/uL (ref 0.0–0.5)
Eosinophils Relative: 4 %
HCT: 37.6 % (ref 36.0–46.0)
Hemoglobin: 12.6 g/dL (ref 12.0–15.0)
Immature Granulocytes: 0 %
Lymphocytes Relative: 35 %
Lymphs Abs: 1.1 10*3/uL (ref 0.7–4.0)
MCH: 29.2 pg (ref 26.0–34.0)
MCHC: 33.5 g/dL (ref 30.0–36.0)
MCV: 87 fL (ref 80.0–100.0)
Monocytes Absolute: 0.4 10*3/uL (ref 0.1–1.0)
Monocytes Relative: 14 %
Neutro Abs: 1.4 10*3/uL — ABNORMAL LOW (ref 1.7–7.7)
Neutrophils Relative %: 46 %
Platelets: 178 10*3/uL (ref 150–400)
RBC: 4.32 MIL/uL (ref 3.87–5.11)
RDW: 12.1 % (ref 11.5–15.5)
WBC: 3.1 10*3/uL — ABNORMAL LOW (ref 4.0–10.5)
nRBC: 0 % (ref 0.0–0.2)

## 2021-05-09 DIAGNOSIS — Z0000 Encounter for general adult medical examination without abnormal findings: Secondary | ICD-10-CM | POA: Diagnosis not present

## 2021-05-12 ENCOUNTER — Inpatient Hospital Stay (HOSPITAL_BASED_OUTPATIENT_CLINIC_OR_DEPARTMENT_OTHER): Payer: Medicare Other | Admitting: Hematology

## 2021-05-12 ENCOUNTER — Other Ambulatory Visit: Payer: Self-pay

## 2021-05-12 VITALS — BP 153/79 | HR 48 | Temp 97.5°F | Resp 18 | Ht 58.47 in | Wt 143.3 lb

## 2021-05-12 DIAGNOSIS — Z79899 Other long term (current) drug therapy: Secondary | ICD-10-CM | POA: Diagnosis not present

## 2021-05-12 DIAGNOSIS — D72819 Decreased white blood cell count, unspecified: Secondary | ICD-10-CM | POA: Diagnosis not present

## 2021-05-12 DIAGNOSIS — R922 Inconclusive mammogram: Secondary | ICD-10-CM

## 2021-05-12 DIAGNOSIS — Z7982 Long term (current) use of aspirin: Secondary | ICD-10-CM | POA: Diagnosis not present

## 2021-05-12 NOTE — Progress Notes (Signed)
? ?[facility] ?[address] ?[place], [place] [address] ? ? ?CLINIC:  ?Medical Oncology/Hematology ? ?PCP:  ?Vernie Shanks, MD ?[place] / [place] [address]  ?[phone] ? ?REASON FOR VISIT:  ?Follow-up for leukopenia ? ?PRIOR THERAPY: not done ? ?CURRENT THERAPY: surveillance ? ?INTERVAL HISTORY:  ?Ms. Wendy [name], a 81 y.o. female, returns for routine follow-up for her leukopenia. Kameran was last seen on 05/10/2020. ? ?Today shew reports feeling good. Her energy is good, and her weight is stable. Since her last visit she reports a Covid infection, and she also reports 1 ear infection for which she took antibiotics. She denies fevers, skin rash, and night sweats. She reports she does not typically get recurrent infections.  ? ?REVIEW OF SYSTEMS:  ?Review of Systems  ?Constitutional:  Negative for appetite change, fatigue, fever and unexpected weight change.  ?Endocrine: Negative for hot flashes.  ?Skin:  Negative for rash.  ?Neurological:  Positive for headaches and numbness.  ?All other systems reviewed and are negative. ? ?PAST MEDICAL/SURGICAL HISTORY:  ?Past Medical History:  ?Diagnosis Date  ? Arthritis   ? hands, neck lower back  ? Cataract   ? extracted  ? GERD (gastroesophageal reflux disease)   ? prior med  ? Hypertension   ? ?Past Surgical History:  ?Procedure Laterality Date  ? CATARACT EXTRACTION, BILATERAL    ? SHOULDER ARTHROSCOPY    ? SHOULDER ARTHROTOMY Left   ? about 2007  ? ? ?SOCIAL HISTORY:  ?Social History  ? ?Socioeconomic History  ? Marital status: Married  ?  Spouse name: Gwyndolyn Saxon  ? Number of children: 3  ? Years of education: Not on file  ? Highest education level: Associate degree: academic program  ?Occupational History  ? Occupation: retired  ?  Comment: OR nurse - RN  ?Tobacco Use  ? Smoking status: Never  ? Smokeless tobacco: Never  ?Vaping Use  ? Vaping Use: Never used  ?Substance and Sexual Activity  ? Alcohol use: No  ? Drug use: No  ? Sexual activity: Not  Currently  ?Other Topics Concern  ? Not on file  ?Social History Narrative  ? Retired Therapist, sports  ? Lives with Husband Gwyndolyn Saxon  ? moved to [place] to be near children  ? Daughter is Esmond Harps  ? ?Social Determinants of Health  ? ?Financial Resource Strain: Not on file  ?Food Insecurity: Not on file  ?Transportation Needs: Not on file  ?Physical Activity: Not on file  ?Stress: Not on file  ?Social Connections: Not on file  ?Intimate Partner Violence: Not on file  ? ? ?FAMILY HISTORY:  ?Family History  ?Problem Relation Age of Onset  ? COPD Mother   ? Arthritis Mother   ? Hearing loss Mother   ? Hypertension Mother   ? Stroke Father 58  ? Diabetes Father   ? Thyroid disease Daughter   ? Diabetes Son   ? Cancer Brother   ?     prostate  ? Mental illness Brother   ?     PTSD from war  ? Heart disease Sister   ? Hyperlipidemia Sister   ? Hypertension Sister   ? Cancer Sister   ?     Leukemia  ? Cancer Maternal Aunt   ?     breast  ? ? ?CURRENT MEDICATIONS:  ?Current Outpatient Medications  ?Medication Sig Dispense Refill  ? ondansetron (ZOFRAN) 4 MG tablet 1 tablet    ? Ascorbic Acid (  VITAMIN C) 500 MG CAPS 2 capsules    ? aspirin EC 81 MG tablet Take 81 mg by mouth daily.    ? Boswellia-Glucosamine-Vit D (GLUCOSAMINE COMPLEX PO) Take 1 tablet by mouth daily.    ? Cholecalciferol 50 MCG (2000 UT) CAPS Take 1 capsule by mouth daily.    ? CIPRODEX OTIC suspension SMARTSIG:Left Ear    ? Glucosamine 500 MG CAPS 1 capsule with a meal    ? losartan-hydrochlorothiazide (HYZAAR) 50-12.5 MG tablet Take 1 tablet by mouth daily.    ? Multiple Vitamin (MULTIVITAMIN) tablet Take 1 tablet by mouth daily.    ? Omega-3 Fatty Acids (FISH OIL OMEGA-3 PO) Take 1 capsule by mouth.    ? ?No current facility-administered medications for this visit.  ? ? ?ALLERGIES:  ?No Known Allergies ? ?PHYSICAL EXAM:  ?Performance status (ECOG): 0 - Asymptomatic ? ?Vitals:  ? 05/12/21 1420  ?BP: (!) 153/79  ?Pulse: (!) 48  ?Resp: 18  ?Temp: (!) 97.5 ?F (36.4 ?C)   ?SpO2: 97%  ? ?Wt Readings from Last 3 Encounters:  ?05/12/21 143 lb 4.8 oz (65 kg)  ?10/15/20 142 lb (64.4 kg)  ?05/10/20 143 lb 8 oz (65.1 kg)  ? ?Physical Exam ?Vitals reviewed.  ?Constitutional:   ?   Appearance: Normal appearance.  ?Cardiovascular:  ?   Rate and Rhythm: Normal rate and regular rhythm.  ?   Pulses: Normal pulses.  ?   Heart sounds: Normal heart sounds.  ?Pulmonary:  ?   Effort: Pulmonary effort is normal.  ?   Breath sounds: Normal breath sounds.  ?Abdominal:  ?   Palpations: Abdomen is soft. There is no hepatomegaly, splenomegaly or mass.  ?   Tenderness: There is no abdominal tenderness.  ?Musculoskeletal:  ?   Right lower leg: No edema.  ?   Left lower leg: No edema.  ?Lymphadenopathy:  ?   Upper Body:  ?   Right upper body: No supraclavicular adenopathy.  ?   Left upper body: No supraclavicular adenopathy.  ?   Lower Body: No right inguinal adenopathy. No left inguinal adenopathy.  ?Neurological:  ?   General: No focal deficit present.  ?   Mental Status: She is alert and oriented to person, place, and time.  ?Psychiatric:     ?   Mood and Affect: Mood normal.     ?   Behavior: Behavior normal.  ? ? ?LABORATORY DATA:  ?I have reviewed the labs as listed.  ?CBC Latest Ref Rng & Units 05/05/2021 05/10/2020 05/10/2020  ?WBC 4.0 - 10.5 K/uL 3.1(L) 2.2(L) -  ?Hemoglobin 12.0 - 15.0 g/dL 12.6 12.1 -  ?Hematocrit 36.0 - 46.0 % 37.6 36.1 36.5  ?Platelets 150 - 400 K/uL 178 154 -  ? ?CMP Latest Ref Rng & Units 09/20/2018 01/18/2017  ?Glucose 65 - 99 mg/dL 81 84  ?BUN 8 - 27 mg/dL 14 17  ?Creatinine 0.57 - 1.00 mg/dL 1.01(H) 0.88  ?Sodium 134 - 144 mmol/L 140 140  ?Potassium 3.5 - 5.2 mmol/L 3.6 4.0  ?Chloride 96 - 106 mmol/L 100 102  ?CO2 20 - 29 mmol/L 25 31  ?Calcium 8.7 - 10.3 mg/dL 10.0 9.9  ?Total Protein 6.0 - 8.5 g/dL 7.2 7.1  ?Total Bilirubin 0.0 - 1.2 mg/dL 0.8 1.3(H)  ?Alkaline Phos 39 - 117 IU/L 49 -  ?AST 0 - 40 IU/L 22 24  ?ALT 0 - 32 IU/L 20 20  ? ?   ?Component Value Date/Time  ? RBC  4.32  05/05/2021 1108  ? MCV 87.0 05/05/2021 1108  ? MCV 88 09/20/2018 1421  ? MCH 29.2 05/05/2021 1108  ? MCHC 33.5 05/05/2021 1108  ? RDW 12.1 05/05/2021 1108  ? RDW 12.1 09/20/2018 1421  ? LYMPHSABS 1.1 05/05/2021 1108  ? LYMPHSABS 1.1 09/20/2018 1421  ? MONOABS 0.4 05/05/2021 1108  ? EOSABS 0.1 05/05/2021 1108  ? EOSABS 0.1 09/20/2018 1421  ? BASOSABS 0.0 05/05/2021 1108  ? BASOSABS 0.0 09/20/2018 1421  ? ? ?DIAGNOSTIC IMAGING:  ?I have independently reviewed the scans and discussed with the patient. ?No results found.  ? ?ASSESSMENT:  ?Chronic leukopenia: ?- Seen by hematology since 1999.  BMBX in 1999 suggested dysplasia with some refractory anemia although her hemoglobin was quite normal.  She was reportedly given Neupogen injections prior to surgical procedures. ?- She then saw a hematologist in [place] and had another bone marrow biopsy which showed no evidence of dysplasia, normocellular marrow and normal cytogenetics. ?- She was thought to have benign neutropenia. ? ? ? ?PLAN:  ?Chronic leukopenia: ?- She denies any B symptoms. ?- She had COVID infection and one ear infection since last visit 1 year ago. ?- No palpable adenopathy.  No signs or symptoms of connective tissue disorder. ?- Reviewed labs from 04/07/2021.  White count is 3.1 with ANC of 1400.  Hemoglobin and platelet count is normal. ?- No indication for further testing at this time.  RTC 1 year with repeat CBC, LDH.  We will also check O16, folic acid, MMA and copper levels. ? ? ? ?Orders placed this encounter:  ?No orders of the defined types were placed in this encounter. ? ? ? ?Derek Jack, MD ?[place] ?[phone] ? ? ?I, Thana Ates, am acting as a scribe for Dr. Derek Jack. ? ?I, Derek Jack MD, have reviewed the above documentation for accuracy and completeness, and I agree with the above. ?  ? ? ?

## 2021-05-12 NOTE — Patient Instructions (Signed)
[place] at [facility] ?Discharge Instructions ? ?You were seen and examined today by Dr. Delton Coombes. He reviewed your most recent labs and everything looks good. Please keep follow up appointments as scheduled in 1 year. ? ? ?Thank you for choosing [place] at [facility] to provide your oncology and hematology care.  To afford each patient quality time with our provider, please arrive at least 15 minutes before your scheduled appointment time.  ? ?If you have a lab appointment with the [facility] please come in thru the Main Entrance and check in at the main information desk. ? ?You need to re-schedule your appointment should you arrive 10 or more minutes late.  We strive to give you quality time with our providers, and arriving late affects you and other patients whose appointments are after yours.  Also, if you no show three or more times for appointments you may be dismissed from the clinic at the providers discretion.     ?Again, thank you for choosing [facility].  Our hope is that these requests will decrease the amount of time that you wait before being seen by our physicians.       ?_____________________________________________________________ ? ?Should you have questions after your visit to [facility], please contact our office at [phone] and follow the prompts.  Our office hours are 8:00 a.m. and 4:30 p.m. Monday - Friday.  Please note that voicemails left after 4:00 p.m. may not be returned until the following business day.  We are closed weekends and major holidays.  You do have access to a nurse 24-7, just call the main number to the clinic [phone] and do not press any options, hold on the line and a nurse will answer the phone.   ? ?For prescription refill requests, have your pharmacy contact our office and allow 72 hours.   ? ?Due to Covid, you will need to wear a mask upon entering the hospital. If you do  not have a mask, a mask will be given to you at the Main Entrance upon arrival. For doctor visits, patients may have 1 support person age 44 or older with them. For treatment visits, patients can not have anyone with them due to social distancing guidelines and our immunocompromised population.  ? ?  ?

## 2021-05-16 DIAGNOSIS — D696 Thrombocytopenia, unspecified: Secondary | ICD-10-CM | POA: Diagnosis not present

## 2021-05-29 DIAGNOSIS — Z01419 Encounter for gynecological examination (general) (routine) without abnormal findings: Secondary | ICD-10-CM | POA: Diagnosis not present

## 2021-06-10 DIAGNOSIS — H2513 Age-related nuclear cataract, bilateral: Secondary | ICD-10-CM | POA: Diagnosis not present

## 2021-07-27 ENCOUNTER — Other Ambulatory Visit: Payer: Self-pay

## 2021-07-27 ENCOUNTER — Emergency Department (HOSPITAL_COMMUNITY)
Admission: EM | Admit: 2021-07-27 | Discharge: 2021-07-27 | Disposition: A | Discharge status: Home / Self Care | Payer: Medicare Other | Attending: Emergency Medicine | Admitting: Emergency Medicine

## 2021-07-27 ENCOUNTER — Encounter (HOSPITAL_COMMUNITY): Payer: Self-pay | Admitting: Emergency Medicine

## 2021-07-27 DIAGNOSIS — D229 Melanocytic nevi, unspecified: Secondary | ICD-10-CM | POA: Insufficient documentation

## 2021-07-27 DIAGNOSIS — Z7982 Long term (current) use of aspirin: Secondary | ICD-10-CM | POA: Insufficient documentation

## 2021-07-27 DIAGNOSIS — I10 Essential (primary) hypertension: Secondary | ICD-10-CM | POA: Diagnosis not present

## 2021-07-27 DIAGNOSIS — L989 Disorder of the skin and subcutaneous tissue, unspecified: Secondary | ICD-10-CM | POA: Diagnosis present

## 2021-07-27 DIAGNOSIS — Z79899 Other long term (current) drug therapy: Secondary | ICD-10-CM | POA: Diagnosis not present

## 2021-07-27 NOTE — ED Triage Notes (Signed)
Pt states she got out of shower tonight and noticed a black spot on her L shoulder and she thought maybe she was bit by something. Here for evaluation.

## 2021-07-27 NOTE — Discharge Instructions (Signed)
You were seen today for skin lesion.  It is consistent with a mole.  It does not have any significant atypical features; however, if you are concerned, you should follow-up with dermatology.

## 2021-07-27 NOTE — ED Provider Notes (Signed)
[facility] Provider Note   CSN: [id number] Arrival date & time: 07/27/21  0048     History  No chief complaint on file.   Wendy [name] is a 81 y.o. female.  HPI     This is an 81 year old female who presents with concern for new lesion on her back.  She wonders whether she may have been bitten by something.  Patient states that she got home this evening from a trip to [place] and was taking a shower.  She was using a mirror to fix her hair when she noted a skin lesion on her left back.  She did not never noticed it before.  She states that it is sometimes itchy.  She not had any fevers or systemic symptoms.  Home Medications Prior to Admission medications   Medication Sig Start Date End Date Taking? Authorizing Provider  Ascorbic Acid (VITAMIN C) 500 MG CAPS 2 capsules    [provider]  aspirin EC 81 MG tablet Take 81 mg by mouth daily.    [provider]  Boswellia-Glucosamine-Vit D (GLUCOSAMINE COMPLEX PO) Take 1 tablet by mouth daily.    [provider]  Cholecalciferol 50 MCG (2000 UT) CAPS Take 1 capsule by mouth daily.    [provider]  CIPRODEX OTIC suspension SMARTSIG:Left Ear 02/18/21   [provider]  Glucosamine 500 MG CAPS 1 capsule with a meal    [provider]  losartan-hydrochlorothiazide (HYZAAR) 50-12.5 MG tablet Take 1 tablet by mouth daily. 07/19/18   [provider]  Multiple Vitamin (MULTIVITAMIN) tablet Take 1 tablet by mouth daily.    [provider]  Omega-3 Fatty Acids (FISH OIL OMEGA-3 PO) Take 1 capsule by mouth.    [provider]  ondansetron (ZOFRAN) 4 MG tablet 1 tablet 04/28/21   [provider]      Allergies    Patient has no known allergies.    Review of Systems   Review of Systems  Skin:        New lesion  All other systems reviewed and are negative.  Physical Exam Updated Vital Signs BP (!) 125/49 (BP Location: Left Arm)    Pulse 62   Temp 97.7 F (36.5 C) (Oral)   Resp 16   Ht 1.473 m ('4\' 10"'$ )   Wt 65.8 kg   SpO2 99%   BMI 30.31 kg/m  Physical Exam Vitals and nursing note reviewed.  Constitutional:      Appearance: She is well-developed. She is not ill-appearing.  HENT:     Head: Normocephalic and atraumatic.     Mouth/Throat:     Mouth: Mucous membranes are moist.  Eyes:     Pupils: Pupils are equal, round, and reactive to light.  Cardiovascular:     Rate and Rhythm: Normal rate and regular rhythm.     Heart sounds: Normal heart sounds.  Pulmonary:     Effort: Pulmonary effort is normal. No respiratory distress.     Breath sounds: No wheezing.  Abdominal:     Palpations: Abdomen is soft.  Skin:    General: Skin is warm and dry.     Comments: Examination of the patient's back with 4 mm fairly well-circumscribed brown mole over the left back, slightly raised, fairly regular  Neurological:     Mental Status: She is alert and oriented to person, place, and time.  Psychiatric:        Mood and Affect: Mood normal.  ED Results / Procedures / Treatments   Labs (all labs ordered are listed, but only abnormal results are displayed) Labs Reviewed - No data to display  EKG None  Radiology No results found.  Procedures Procedures    Medications Ordered in ED Medications - No data to display  ED Course/ Medical Decision Making/ A&P                           Medical Decision Making  This patient presents to the ED for concern of skin lesion, this involves an extensive number of treatment options, and is a complaint that carries with it a high risk of complications and morbidity.  I considered the following differential and admission for this acute, potentially life threatening condition.  The differential diagnosis includes mole, insect bite, cellulitis, abscess  MDM:    81 year old female who presents with a skin lesion to her back.  She is nontoxic and vital signs are reassuring.   After examination, it appears she has a mole in the area of concern.  She states she has never noticed it before.  It is out of view and would likely only be noticeable if she was doing a specific skin inspection.  It does not have any highly irregular features.  It is brown in color and fairly regular.  I discussed with the patient that I felt that this was likely benign; however given that I am not a dermatologist, would recommend she follow-up with dermatology for full skin check.  No infectious concerns.  (Labs, imaging, consults)  Labs: I Ordered, and personally interpreted labs.  The pertinent results include: None  Imaging Studies ordered: I ordered imaging studies including none I independently visualized and interpreted imaging. I agree with the radiologist interpretation  Additional history obtained from his been at bedside.  External records from outside source obtained and reviewed including prior evaluations  Cardiac Monitoring: The patient was maintained on a cardiac monitor.  I personally viewed and interpreted the cardiac monitored which showed an underlying rhythm of: Normal sinus rhythm  Reevaluation: After the interventions noted above, I reevaluated the patient and found that they have :stayed the same  Social Determinants of Health: Lives independently  Disposition: Discharge  Co morbidities that complicate the patient evaluation  Past Medical History:  Diagnosis Date   Arthritis    hands, neck lower back   Cataract    extracted   GERD (gastroesophageal reflux disease)    prior med   Hypertension      Medicines No orders of the defined types were placed in this encounter.   I have reviewed the patients home medicines and have made adjustments as needed  Problem List / ED Course: Problem List Items Addressed This Visit   None Visit Diagnoses     Skin mole    -  Primary                   Final Clinical Impression(s) / ED  Diagnoses Final diagnoses:  Skin mole    Rx / DC Orders ED Discharge Orders     None         Emrey Thornley, Barbette Hair, MD 07/27/21 0130

## 2021-10-09 ENCOUNTER — Encounter (HOSPITAL_BASED_OUTPATIENT_CLINIC_OR_DEPARTMENT_OTHER): Payer: Self-pay | Admitting: Emergency Medicine

## 2021-10-09 ENCOUNTER — Emergency Department (HOSPITAL_BASED_OUTPATIENT_CLINIC_OR_DEPARTMENT_OTHER): Payer: Medicare Other

## 2021-10-09 ENCOUNTER — Other Ambulatory Visit: Payer: Self-pay

## 2021-10-09 ENCOUNTER — Emergency Department (HOSPITAL_BASED_OUTPATIENT_CLINIC_OR_DEPARTMENT_OTHER)
Admission: EM | Admit: 2021-10-09 | Discharge: 2021-10-09 | Disposition: A | Discharge status: Home / Self Care | Payer: Medicare Other | Attending: Emergency Medicine | Admitting: Emergency Medicine

## 2021-10-09 DIAGNOSIS — R079 Chest pain, unspecified: Secondary | ICD-10-CM

## 2021-10-09 DIAGNOSIS — R0789 Other chest pain: Secondary | ICD-10-CM | POA: Insufficient documentation

## 2021-10-09 DIAGNOSIS — R001 Bradycardia, unspecified: Secondary | ICD-10-CM | POA: Diagnosis not present

## 2021-10-09 DIAGNOSIS — Z7982 Long term (current) use of aspirin: Secondary | ICD-10-CM | POA: Diagnosis not present

## 2021-10-09 DIAGNOSIS — Z79899 Other long term (current) drug therapy: Secondary | ICD-10-CM | POA: Diagnosis not present

## 2021-10-09 DIAGNOSIS — I10 Essential (primary) hypertension: Secondary | ICD-10-CM | POA: Insufficient documentation

## 2021-10-09 LAB — CBC WITH DIFFERENTIAL/PLATELET
Abs Immature Granulocytes: 0.01 10*3/uL (ref 0.00–0.07)
Basophils Absolute: 0 10*3/uL (ref 0.0–0.1)
Basophils Relative: 1 %
Eosinophils Absolute: 0 10*3/uL (ref 0.0–0.5)
Eosinophils Relative: 1 %
HCT: 36.9 % (ref 36.0–46.0)
Hemoglobin: 12.6 g/dL (ref 12.0–15.0)
Immature Granulocytes: 0 %
Lymphocytes Relative: 29 %
Lymphs Abs: 0.9 10*3/uL (ref 0.7–4.0)
MCH: 29.9 pg (ref 26.0–34.0)
MCHC: 34.1 g/dL (ref 30.0–36.0)
MCV: 87.4 fL (ref 80.0–100.0)
Monocytes Absolute: 0.3 10*3/uL (ref 0.1–1.0)
Monocytes Relative: 11 %
Neutro Abs: 1.7 10*3/uL (ref 1.7–7.7)
Neutrophils Relative %: 58 %
Platelets: 150 10*3/uL (ref 150–400)
RBC: 4.22 MIL/uL (ref 3.87–5.11)
RDW: 11.9 % (ref 11.5–15.5)
WBC: 3 10*3/uL — ABNORMAL LOW (ref 4.0–10.5)
nRBC: 0 % (ref 0.0–0.2)

## 2021-10-09 LAB — BASIC METABOLIC PANEL
Anion gap: 9 (ref 5–15)
BUN: 22 mg/dL (ref 8–23)
CO2: 27 mmol/L (ref 22–32)
Calcium: 9.5 mg/dL (ref 8.9–10.3)
Chloride: 101 mmol/L (ref 98–111)
Creatinine, Ser: 1.13 mg/dL — ABNORMAL HIGH (ref 0.44–1.00)
GFR, Estimated: 49 mL/min — ABNORMAL LOW (ref >60)
Glucose, Bld: 111 mg/dL — ABNORMAL HIGH (ref 70–99)
Potassium: 3.7 mmol/L (ref 3.5–5.1)
Sodium: 137 mmol/L (ref 135–145)

## 2021-10-09 LAB — TROPONIN I (HIGH SENSITIVITY)
Troponin I (High Sensitivity): 3 ng/L (ref <18)
Troponin I (High Sensitivity): 3 ng/L (ref <18)

## 2021-10-09 LAB — MAGNESIUM: Magnesium: 2 mg/dL (ref 1.7–2.4)

## 2021-10-09 LAB — CBG MONITORING, ED: Glucose-Capillary: 110 mg/dL — ABNORMAL HIGH (ref 70–99)

## 2021-10-09 MED ORDER — FAMOTIDINE IN NACL 20-0.9 MG/50ML-% IV SOLN
20.0000 mg | Freq: Once | INTRAVENOUS | Status: AC
  Administered 2021-10-09: 20 mg via INTRAVENOUS
  Filled 2021-10-09: qty 50

## 2021-10-09 MED ORDER — ALUM & MAG HYDROXIDE-SIMETH 200-200-20 MG/5ML PO SUSP
30.0000 mL | Freq: Once | ORAL | Status: AC
  Administered 2021-10-09: 30 mL via ORAL
  Filled 2021-10-09: qty 30

## 2021-10-09 MED ORDER — FAMOTIDINE 20 MG PO TABS: 20.0000 mg | ORAL_TABLET | Freq: Every day | ORAL | 0 refills | Status: DC

## 2021-10-09 NOTE — ED Provider Notes (Signed)
[facility] EMERGENCY DEPARTMENT Provider Note   CSN: [id number] Arrival date & time: 10/09/21  4235     History  Chief Complaint  Patient presents with   Chest Pain    Wendy [name] is a 81 y.o. female.   And-year-old female with a history of sinus bradycardia and hypertension who presented to the emergency department with chest discomfort.  Patient states that she woke up from sleep this morning at 4 AM with chest discomfort.  She describes it in her chest also in her back as a pressure-like sensation.  Says that it feels like reflux.  Says that it is not exertional nor pleuritic.  Not associated with diaphoresis or nausea or vomiting.  Says that she took 324 of aspirin at home prior to arrival and decided to come in for evaluation.  Denies any fevers or cough recently.  No lower extremity swelling.  Both of her sisters had an MI in their 71s but denies any heart attacks and young immediate relatives.  She does not have any history of stents or bypass.   Chest Pain  Past Medical History:  Diagnosis Date   Arthritis    hands, neck lower back   Cataract    extracted   GERD (gastroesophageal reflux disease)    prior med   Hypertension        Home Medications Prior to Admission medications   Medication Sig Start Date End Date Taking? Authorizing Provider  famotidine (PEPCID) 20 MG tablet Take 1 tablet (20 mg total) by mouth daily. 10/09/21  Yes Fransico Meadow, MD  Ascorbic Acid (VITAMIN C) 500 MG CAPS 2 capsules    [provider]  aspirin EC 81 MG tablet Take 81 mg by mouth daily.    [provider]  Boswellia-Glucosamine-Vit D (GLUCOSAMINE COMPLEX PO) Take 1 tablet by mouth daily.    [provider]  Cholecalciferol 50 MCG (2000 UT) CAPS Take 1 capsule by mouth daily.    [provider]  CIPRODEX OTIC suspension SMARTSIG:Left Ear 02/18/21   [provider]  Glucosamine 500 MG CAPS 1 capsule with a meal    [provider]  losartan-hydrochlorothiazide (HYZAAR) 50-12.5 MG tablet Take 1 tablet by mouth daily. 07/19/18   [provider]  Multiple Vitamin (MULTIVITAMIN) tablet Take 1 tablet by mouth daily.    [provider]  Omega-3 Fatty Acids (FISH OIL OMEGA-3 PO) Take 1 capsule by mouth.    [provider]  ondansetron (ZOFRAN) 4 MG tablet 1 tablet 04/28/21   [provider]      Allergies    Patient has no known allergies.    Review of Systems   Review of Systems  Cardiovascular:  Positive for chest pain.    Physical Exam Updated Vital Signs BP 139/73   Pulse (!) 47   Temp 97.7 F (36.5 C) (Oral)   Resp 20   Ht '4\' 10"'$  (1.473 m)   Wt 66.2 kg   SpO2 100%   BMI 30.51 kg/m  Physical Exam Vitals and nursing note reviewed.  Constitutional:      General: She is not in acute distress.    Appearance: She is well-developed.  HENT:     Head: Normocephalic and atraumatic.     Right Ear: External ear normal.     Left Ear: External ear normal.     Nose: Nose normal.  Eyes:     Extraocular Movements: Extraocular movements intact.  Conjunctiva/sclera: Conjunctivae normal.     Pupils: Pupils are equal, round, and reactive to light.  Cardiovascular:     Rate and Rhythm: Regular rhythm. Bradycardia present.     Heart sounds: No murmur heard.    Comments: No reproducible chest pain Pulmonary:     Effort: Pulmonary effort is normal. No respiratory distress.     Breath sounds: Normal breath sounds.  Abdominal:     General: Abdomen is flat. There is no distension.     Palpations: Abdomen is soft. There is no mass.     Tenderness: There is no abdominal tenderness. There is no guarding.  Musculoskeletal:        General: No swelling.     Cervical back: Normal range of motion and neck supple.     Right lower leg: No edema.     Left lower leg: No edema.  Skin:    General: Skin is warm and dry.     Capillary Refill: Capillary refill takes less than 2  seconds.  Neurological:     Mental Status: She is alert and oriented to person, place, and time. Mental status is at baseline.  Psychiatric:        Mood and Affect: Mood normal.     ED Results / Procedures / Treatments   Labs (all labs ordered are listed, but only abnormal results are displayed) Labs Reviewed  BASIC METABOLIC PANEL - Abnormal; Notable for the following components:      Result Value   Glucose, Bld 111 (*)    Creatinine, Ser 1.13 (*)    GFR, Estimated 49 (*)    All other components within normal limits  CBC WITH DIFFERENTIAL/PLATELET - Abnormal; Notable for the following components:   WBC 3.0 (*)    All other components within normal limits  CBG MONITORING, ED - Abnormal; Notable for the following components:   Glucose-Capillary 110 (*)    All other components within normal limits  MAGNESIUM  TROPONIN I (HIGH SENSITIVITY)  TROPONIN I (HIGH SENSITIVITY)    EKG EKG Interpretation  Date/Time:  Thursday October 09 2021 09:48:54 EDT Ventricular Rate:  48 PR Interval:  191 QRS Duration: 91 QT Interval:  459 QTC Calculation: 411 R Axis:   11 Text Interpretation: Sinus bradycardia Atrial premature complexes in couplets Borderline repolarization abnormality Confirmed by Margaretmary Eddy [phone]) on 10/09/2021 10:10:55 AM  Radiology DG Chest Portable 1 View  Result Date: 10/09/2021 CLINICAL DATA:  Left chest pain EXAM: PORTABLE CHEST 1 VIEW COMPARISON:  03/31/2021 FINDINGS: The heart size and mediastinal contours are within normal limits. Both lungs are clear. No pleural effusion or pneumothorax. The visualized skeletal structures are unremarkable. IMPRESSION: No acute process in the chest. Electronically Signed   By: Macy Mis M.D.   On: 10/09/2021 10:37    Procedures Procedures   Medications Ordered in ED Medications  famotidine (PEPCID) IVPB 20 mg premix (0 mg Intravenous Stopped 10/09/21 1303)  alum & mag hydroxide-simeth (MAALOX/MYLANTA) 200-200-20  MG/5ML suspension 30 mL (30 mLs Oral Given 10/09/21 1145)    ED Course/ Medical Decision Making/ A&P Clinical Course as of 10/09/21 2023  Thu Oct 09, 2021  1110 Chest x-ray reviewed and interpreted by me as showing no acute abnormality. [RP]  1309 ECG Heart Rate(!): 43 Baseline HR based on prior visits in the 53s and 52s.  [RP]  1316 Heart Score of 4.  [RP]    Clinical Course User Index [RP] Fransico Meadow, MD  Medical Decision Making Amount and/or Complexity of Data Reviewed Labs: ordered. Radiology: ordered.  Risk OTC drugs. Prescription drug management.   Wendy [name] is a 82 y.o. female with history of sinus bradycardia and hypertension who presents with chief complaint of chest discomfort.  Initial DDx: MI, dissection, GERD  Plan:  Labs  troponins Chest x-ray GI cocktail   ED Summary:  Patient underwent the above work-up.  She was noted to be in sinus bradycardia during her stay in the emergency department.  Did have an outpatient visit where her heart rate was in the 40s and an additional appointment where they were noted to be in the 50s so it appears that this is likely her baseline.  Not complaining of any dizziness at this time and her blood pressures remained stable so no medications were given for this.  To evaluate for possible MI she had serial troponins that were WNL and stable.  Her EKG did not reveal any concerning findings and did confirm that it was sinus bradycardia.  Considered dissection but given the patient's symmetric pulses and no neurologic symptoms with a chest x-ray that does not show widened mediastinum feel this is highly unlikely at this time.  Patient did improve with her GI cocktail and was discharged home with instructions to follow-up with her primary care doctor in several days given her heart score of 4.   Additional history obtained from spouse Records reviewed Care Everywhere/External Records  I independently  visualized the following imaging with scope of interpretation limited to determining acute life threatening conditions related to emergency care: Chest x-ray, which revealed no acute abnormality   Final Clinical Impression(s) / ED Diagnoses Final diagnoses:  Chest pain, unspecified type    Rx / DC Orders ED Discharge Orders          Ordered    famotidine (PEPCID) 20 MG tablet  Daily        10/09/21 1318              Fransico Meadow, MD 10/09/21 2023

## 2021-10-09 NOTE — Discharge Instructions (Signed)
Today you were seen in the emergency department for your chest pain.    In the emergency department you had an EKG, troponins (lab work), and chest x-ray that were reassuring. It is unclear exactly what is causing your symptoms but it is likely due to reflux.     At home, please take maalox and the pepcid we have prescribed you for your reflux.    Follow-up with your primary doctor or cardiologist in 2-3 days regarding your visit. You may also follow-up with GI about your reflux.   Return immediately to the emergency department if you experience any of the following: chest pain, difficulty breathing, or any other concerning symptoms.    Thank you for visiting our Emergency Department. It was a pleasure taking care of you today.

## 2021-10-09 NOTE — ED Triage Notes (Signed)
Pt having left sided chest pain radiating to back since 4 am, woke her from sleep.  Pt also relates moved to left arm.  No sob, no N/V, no diaphoresis.  Pt felt like she had indigestion.  Pt took some antiacid and it did help, she also took '324mg'$  baby aspirin.  She feels better but came due to her history

## 2021-10-10 DIAGNOSIS — H04123 Dry eye syndrome of bilateral lacrimal glands: Secondary | ICD-10-CM | POA: Diagnosis not present

## 2021-10-10 DIAGNOSIS — H0288B Meibomian gland dysfunction left eye, upper and lower eyelids: Secondary | ICD-10-CM | POA: Diagnosis not present

## 2021-10-10 DIAGNOSIS — H0288A Meibomian gland dysfunction right eye, upper and lower eyelids: Secondary | ICD-10-CM | POA: Diagnosis not present

## 2021-10-16 DIAGNOSIS — D72819 Decreased white blood cell count, unspecified: Secondary | ICD-10-CM | POA: Diagnosis not present

## 2021-10-16 DIAGNOSIS — R413 Other amnesia: Secondary | ICD-10-CM | POA: Diagnosis not present

## 2021-10-16 DIAGNOSIS — R001 Bradycardia, unspecified: Secondary | ICD-10-CM | POA: Diagnosis not present

## 2021-10-16 DIAGNOSIS — M48061 Spinal stenosis, lumbar region without neurogenic claudication: Secondary | ICD-10-CM | POA: Diagnosis not present

## 2021-10-16 DIAGNOSIS — E559 Vitamin D deficiency, unspecified: Secondary | ICD-10-CM | POA: Diagnosis not present

## 2021-10-16 DIAGNOSIS — Z23 Encounter for immunization: Secondary | ICD-10-CM | POA: Diagnosis not present

## 2021-10-16 DIAGNOSIS — I6521 Occlusion and stenosis of right carotid artery: Secondary | ICD-10-CM | POA: Diagnosis not present

## 2021-10-16 DIAGNOSIS — I10 Essential (primary) hypertension: Secondary | ICD-10-CM | POA: Diagnosis not present

## 2021-10-17 ENCOUNTER — Encounter: Payer: Self-pay | Admitting: Cardiology

## 2021-10-17 ENCOUNTER — Ambulatory Visit: Payer: Medicare Other | Admitting: Cardiology

## 2021-10-17 VITALS — BP 143/69 | HR 56 | Temp 98.7°F | Resp 16 | Ht <= 58 in | Wt 140.8 lb

## 2021-10-17 DIAGNOSIS — I10 Essential (primary) hypertension: Secondary | ICD-10-CM

## 2021-10-17 DIAGNOSIS — R001 Bradycardia, unspecified: Secondary | ICD-10-CM | POA: Diagnosis not present

## 2021-10-17 DIAGNOSIS — R072 Precordial pain: Secondary | ICD-10-CM | POA: Diagnosis not present

## 2021-10-17 NOTE — Progress Notes (Signed)
ID:  Wendy [name], DOB [birth year], MRN [id number]  PCP:  Vernie Shanks, MD (Inactive)  Cardiologist:  Rex Kras, DO, [facility] (established care 09/05/2019)  Date: 10/17/21 Last Office Visit: 10/15/2020  Chief Complaint  Patient presents with   Follow-up   Bradycardia    HPI  Wendy [name] is a 81 y.o. female whose past medical history and cardiovascular risk factors include: Hx of COVID 19 (July 2022), Sinus bradycardia, hypertension, postmenopausal female, advanced age.  Patient was referred to the practice for evaluation of bradycardia.  She underwent cardiovascular work-up as outlined below and overall unremarkable.  Exercise nuclear stress test in the past has illustrated good chronotropic competence.  She is here for 1 year follow-up visit.  Last 1 year patient has been doing well until recently in August 2023 she did have a ER visit for chest pain.  EKG not consistent with ACS and high sensitive troponins were negative x2.  Has not had any reoccurrence.  Overall physical capacity remains relatively stable.  She denies any active chest pain, heart failure symptoms, near-syncope or syncopal events.  ALLERGIES: No Known Allergies  MEDICATION LIST PRIOR TO VISIT: Current Meds  Medication Sig   Ascorbic Acid (VITAMIN C) 500 MG CAPS 2 capsules   aspirin EC 81 MG tablet Take 81 mg by mouth daily.   CIPRODEX OTIC suspension SMARTSIG:Left Ear   Glucosamine 500 MG CAPS 1 capsule with a meal   losartan-hydrochlorothiazide (HYZAAR) 50-12.5 MG tablet Take 1 tablet by mouth daily.   Multiple Vitamin (MULTIVITAMIN) tablet Take 1 tablet by mouth daily.   Omega-3 Fatty Acids (FISH OIL OMEGA-3 PO) Take 1 capsule by mouth.     PAST MEDICAL HISTORY: Past Medical History:  Diagnosis Date   Arthritis    hands, neck lower back   Cataract    extracted   GERD (gastroesophageal reflux disease)    prior med   Hypertension     PAST SURGICAL HISTORY: Past Surgical History:  Procedure  Laterality Date   CATARACT EXTRACTION, BILATERAL     SHOULDER ARTHROSCOPY     SHOULDER ARTHROTOMY Left    about 2007    FAMILY HISTORY: The patient family history includes Arthritis in her mother; COPD in her mother; Cancer in her brother, maternal aunt, and sister; Diabetes in her father and son; Hearing loss in her mother; Heart disease in her sister; Hyperlipidemia in her sister; Hypertension in her mother and sister; Mental illness in her brother; Stroke (age of onset: 42) in her father; Thyroid disease in her daughter.  SOCIAL HISTORY:  The patient  reports that she has never smoked. She has never used smokeless tobacco. She reports that she does not drink alcohol and does not use drugs.  REVIEW OF SYSTEMS: Review of Systems  Cardiovascular:  Positive for chest pain (No active discomfort, see HPI). Negative for claudication, dyspnea on exertion, irregular heartbeat, leg swelling, near-syncope, orthopnea, palpitations, paroxysmal nocturnal dyspnea and syncope.  Respiratory:  Negative for shortness of breath.   Hematologic/Lymphatic: Negative for bleeding problem.  Musculoskeletal:  Negative for muscle cramps and myalgias.  Neurological:  Negative for dizziness and light-headedness.    PHYSICAL EXAM:    10/17/2021   10:11 AM 10/09/2021    1:30 PM 10/09/2021    1:00 PM  Vitals with BMI  Height '4\' 10"'$     Weight 140 lbs 13 oz    BMI 80.99    Systolic [phone]  Diastolic 69 73 69  Pulse 56 47  42   Physical Exam  Constitutional: No distress.  Age appropriate, hemodynamically stable.   Neck: No JVD present.  Cardiovascular: Regular rhythm, S1 normal, S2 normal and intact distal pulses. Bradycardia present. Exam reveals no gallop, no S3 and no S4.  No murmur heard. Pulses:      Carotid pulses are  on the right side with bruit.      Dorsalis pedis pulses are 1+ on the right side and 1+ on the left side.       Posterior tibial pulses are 2+ on the right side and 2+ on the left  side.  Pulmonary/Chest: Effort normal and breath sounds normal. No stridor. She has no wheezes. She has no rales.  Abdominal: Soft. Bowel sounds are normal. She exhibits no distension. There is no abdominal tenderness.  Musculoskeletal:        General: No edema.     Cervical back: Neck supple.  Neurological: She is alert and oriented to person, place, and time. She has intact cranial nerves (2-12).  Skin: Skin is warm and moist.   CARDIAC DATABASE: EKG: 10/17/21 Sinus bradycardia, 57 bpm, LAE, without underlying ischemia injury  Echocardiogram: 09/08/2019: Normal LV systolic function with visual EF 60-65%. Left ventricle cavity is normal in size. Normal global wall motion. Normal diastolic filling pattern, normal LAP. Calculated EF 65%. Mild (Grade I) mitral regurgitation. Mild tricuspid regurgitation. No prior study for comparison.  Stress Testing: Exercise Myoview stress test 09/25/2019: Exercise nuclear stress test was performed using Bruce protocol. Patient reached 7.7 METS, and 91% of age predicted maximum heart rate. Exercise capacity was fair. Chest pain not reported. Normal heart rate and hemodynamic response.  Peak EKG/ECG demonstrated sinus tachycardia. occasional PAC's, 1 mm horizontal ST depression present in leads II, III, aVF, V4-V6. EKG changes normalized within 1 min into recovery. Given norma myocardial perfusion, EKG changes are likely false positive.  Normal myocardial perfusion. Stress LVEF 69%. Low risk study.  Heart Catheterization: None  Carotid artery duplex  16/60/6301: Peak systolic velocities in the right bifurcation, internal, external and common carotid arteries are within normal limits. Minimal stenosis in the left internal carotid artery (minimal) with homogeneous plaque. Antegrade right vertebral artery flow. Antegrade left vertebral artery flow.  24 hour Holter monitor: Dominant rhythm normal sinus. Heart rate 42-154 bpm.  Average heart rate 61  bpm. Minimum heart rate of 42 bpm occurred at 4:10am on 09/09/2019.  No atrial fibrillation/ventricular tachycardia/high grade AV block, sinus pause greater than or equal to 3 seconds in duration. Total ventricular ectopic burden <1%. Total supraventricular ectopic burden <1%. One auto detected episode of supraventricular tachycardia was 11 beats in duration at a maximum rate of 154 bpm. Number of patient triggered events: 2.  Underlying rhythm is normal sinus without any significant dysrhythmias.  LABORATORY DATA:    Latest Ref Rng & Units 10/09/2021    9:56 AM 05/05/2021   11:08 AM 05/10/2020   10:29 AM  CBC  WBC 4.0 - 10.5 K/uL 3.0  3.1  2.2   Hemoglobin 12.0 - 15.0 g/dL 12.6  12.6  12.1   Hematocrit 36.0 - 46.0 % 36.9  37.6  36.1    36.5   Platelets 150 - 400 K/uL 150  178  154        Latest Ref Rng & Units 10/09/2021    9:56 AM 09/20/2018    2:21 PM 01/18/2017   12:57 PM  CMP  Glucose 70 - 99 mg/dL 111  81  84  BUN 8 - 23 mg/dL $Remove'22  14  17   'SdPhcIM$ Creatinine 0.44 - 1.00 mg/dL 1.13  1.01  0.88   Sodium 135 - 145 mmol/L 137  140  140   Potassium 3.5 - 5.1 mmol/L 3.7  3.6  4.0   Chloride 98 - 111 mmol/L 101  100  102   CO2 22 - 32 mmol/L $RemoveB'27  25  31   'eMKWsqSJ$ Calcium 8.9 - 10.3 mg/dL 9.5  10.0  9.9   Total Protein 6.0 - 8.5 g/dL  7.2  7.1   Total Bilirubin 0.0 - 1.2 mg/dL  0.8  1.3   Alkaline Phos 39 - 117 IU/L  49    AST 0 - 40 IU/L  22  24   ALT 0 - 32 IU/L  20  20     Lipid Panel     Component Value Date/Time   CHOL 186 01/18/2017 1257   TRIG 49 01/18/2017 1257   HDL 128 01/18/2017 1257   CHOLHDL 1.5 01/18/2017 1257   LDLCALC 44 01/18/2017 1257    No components found for: "NTPROBNP" No results for input(s): "PROBNP" in the last 8760 hours. No results for input(s): "TSH" in the last 8760 hours.  BMP Recent Labs    10/09/21 0956  NA 137  K 3.7  CL 101  CO2 27  GLUCOSE 111*  BUN 22  CREATININE 1.13*  CALCIUM 9.5  GFRNONAA 49*    HEMOGLOBIN A1C Lab Results   Component Value Date   HGBA1C 5.6% 05/22/2017   External Labs: Collected: 08/14/2019 Creatinine 1.02 mg/dL. eGFR: 63 mL/min per 1.73 m TSH: 0.81   IMPRESSION:    ICD-10-CM   1. Precordial pain  R07.2 EKG 12-Lead    CT CARDIAC SCORING ([facility] LOCATIONS ONLY)    PCV ECHOCARDIOGRAM COMPLETE    2. Sinus bradycardia  R00.1 EKG 12-Lead    3. Benign hypertension  I10        RECOMMENDATIONS: Tiersa Dayley is a 81 y.o. female whose past medical history and cardiac risk factors include: Hx of COVID 19 (July 2022), Sinus bradycardia, hypertension, postmenopausal female, advanced age.  Precordial pain Recently had a ER visit in August 2023.  Records reviewed. Currently asymptomatic. Her recent episode likely due to heartburn/GERD. We will repeat echocardiogram to reevaluate LVEF and regional wall motion abnormalities. Coronary calcium score for further risk stratification. Continue aspirin and statin therapy. Lipids currently being managed by PCP. Patient is asked to call the office if she has recurrent episodes for additional ischemic evaluation.  Sinus bradycardia Asymptomatic. Has undergone prior work-up as noted above  Benign hypertension Office blood pressures are within acceptable range but currently not at goal. Medications reconciled. Reemphasized importance of a low-salt diet. Currently managed by primary care provider.  FINAL MEDICATION LIST END OF ENCOUNTER: No orders of the defined types were placed in this encounter.   Current Outpatient Medications:    Ascorbic Acid (VITAMIN C) 500 MG CAPS, 2 capsules, Disp: , Rfl:    aspirin EC 81 MG tablet, Take 81 mg by mouth daily., Disp: , Rfl:    CIPRODEX OTIC suspension, SMARTSIG:Left Ear, Disp: , Rfl:    Glucosamine 500 MG CAPS, 1 capsule with a meal, Disp: , Rfl:    losartan-hydrochlorothiazide (HYZAAR) 50-12.5 MG tablet, Take 1 tablet by mouth daily., Disp: , Rfl:    Multiple Vitamin (MULTIVITAMIN) tablet, Take 1 tablet  by mouth daily., Disp: , Rfl:    Omega-3 Fatty Acids (FISH OIL OMEGA-3 PO),  Take 1 capsule by mouth., Disp: , Rfl:   Orders Placed This Encounter  Procedures   CT CARDIAC SCORING ([facility] LOCATIONS ONLY)   EKG 12-Lead   PCV ECHOCARDIOGRAM COMPLETE    --Continue cardiac medications as reconciled in final medication list. --Return in about 1 year (around 10/18/2022) for Follow up Bradycardia . Or sooner if needed. --Continue follow-up with your primary care physician regarding the management of your other chronic comorbid conditions.  Patient's questions and concerns were addressed to her satisfaction. She voices understanding of the instructions provided during this encounter.   This note was created using a voice recognition software as a result there may be grammatical errors inadvertently enclosed that do not reflect the nature of this encounter. Every attempt is made to correct such errors.  Rex Kras, [place], [facility]  Pager: [phone] Office: [phone]

## 2021-10-21 DIAGNOSIS — M5440 Lumbago with sciatica, unspecified side: Secondary | ICD-10-CM | POA: Diagnosis not present

## 2021-10-22 ENCOUNTER — Ambulatory Visit
Admission: RE | Admit: 2021-10-22 | Discharge: 2021-10-22 | Disposition: A | Discharge status: Home / Self Care | Payer: No Typology Code available for payment source | Source: Ambulatory Visit | Attending: Cardiology | Admitting: Cardiology

## 2021-10-22 DIAGNOSIS — I700 Atherosclerosis of aorta: Secondary | ICD-10-CM | POA: Diagnosis not present

## 2021-10-22 DIAGNOSIS — R072 Precordial pain: Secondary | ICD-10-CM

## 2021-10-31 DIAGNOSIS — Z23 Encounter for immunization: Secondary | ICD-10-CM | POA: Diagnosis not present

## 2021-11-12 DIAGNOSIS — M4310 Spondylolisthesis, site unspecified: Secondary | ICD-10-CM | POA: Diagnosis not present

## 2021-11-12 DIAGNOSIS — M47816 Spondylosis without myelopathy or radiculopathy, lumbar region: Secondary | ICD-10-CM | POA: Diagnosis not present

## 2021-11-14 ENCOUNTER — Ambulatory Visit: Payer: Medicare Other

## 2021-11-14 DIAGNOSIS — R072 Precordial pain: Secondary | ICD-10-CM

## 2021-12-08 NOTE — Progress Notes (Signed)
Called and spoke with patient regarding her recent lab results.

## 2021-12-08 NOTE — Progress Notes (Signed)
External Labs: Collected: 10/16/2021 provided by primary physician. BUN 23, creatinine 1.11. eGFR 50. Sodium 139, potassium 3.9, chloride 102, bicarb 29  Collected: 11/13/2021 Hemoglobin 13.9 g/dL, hematocrit 41.1% BUN 16, creatinine 0.95. Sodium 141, potassium 4.1, chloride 99, bicarb 33. AST 26, ALT 25. TSH 2.43 Total cholesterol 139, triglycerides 82, HDL 52, LDL 71, non-HDL 87  Outside labs reviewed. Renal function and lipids well controlled  Ravi Tuccillo [place], DO, [facility]

## 2021-12-08 NOTE — Progress Notes (Signed)
Called pt inform her about her echo results. Pt understood.

## 2021-12-18 ENCOUNTER — Ambulatory Visit (HOSPITAL_COMMUNITY): Payer: Medicare Other | Admitting: Physical Therapy

## 2021-12-25 DIAGNOSIS — D72819 Decreased white blood cell count, unspecified: Secondary | ICD-10-CM | POA: Diagnosis not present

## 2021-12-25 DIAGNOSIS — H02413 Mechanical ptosis of bilateral eyelids: Secondary | ICD-10-CM | POA: Diagnosis not present

## 2021-12-25 DIAGNOSIS — H02834 Dermatochalasis of left upper eyelid: Secondary | ICD-10-CM | POA: Diagnosis not present

## 2021-12-25 DIAGNOSIS — H53483 Generalized contraction of visual field, bilateral: Secondary | ICD-10-CM | POA: Diagnosis not present

## 2021-12-25 DIAGNOSIS — H0279 Other degenerative disorders of eyelid and periocular area: Secondary | ICD-10-CM | POA: Diagnosis not present

## 2021-12-25 DIAGNOSIS — H57813 Brow ptosis, bilateral: Secondary | ICD-10-CM | POA: Diagnosis not present

## 2021-12-25 DIAGNOSIS — H02423 Myogenic ptosis of bilateral eyelids: Secondary | ICD-10-CM | POA: Diagnosis not present

## 2021-12-25 DIAGNOSIS — H02831 Dermatochalasis of right upper eyelid: Secondary | ICD-10-CM | POA: Diagnosis not present

## 2021-12-29 DIAGNOSIS — Z23 Encounter for immunization: Secondary | ICD-10-CM | POA: Diagnosis not present
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
## 2021-12-30 ENCOUNTER — Ambulatory Visit (HOSPITAL_COMMUNITY): Payer: Medicare Other | Attending: Physical Medicine and Rehabilitation

## 2021-12-30 DIAGNOSIS — M545 Low back pain, unspecified: Secondary | ICD-10-CM | POA: Diagnosis not present

## 2021-12-30 DIAGNOSIS — M47816 Spondylosis without myelopathy or radiculopathy, lumbar region: Secondary | ICD-10-CM | POA: Diagnosis not present

## 2022-01-07 ENCOUNTER — Ambulatory Visit (HOSPITAL_COMMUNITY): Payer: Medicare Other

## 2022-01-07 DIAGNOSIS — M545 Low back pain, unspecified: Secondary | ICD-10-CM

## 2022-01-07 DIAGNOSIS — M47816 Spondylosis without myelopathy or radiculopathy, lumbar region: Secondary | ICD-10-CM | POA: Diagnosis not present
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
## 2022-01-08 ENCOUNTER — Ambulatory Visit (HOSPITAL_COMMUNITY): Payer: Medicare Other | Admitting: Physical Therapy

## 2022-01-08 DIAGNOSIS — M47816 Spondylosis without myelopathy or radiculopathy, lumbar region: Secondary | ICD-10-CM | POA: Diagnosis not present

## 2022-01-08 DIAGNOSIS — M545 Low back pain, unspecified: Secondary | ICD-10-CM
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
## 2022-01-13 ENCOUNTER — Ambulatory Visit (HOSPITAL_COMMUNITY): Payer: Medicare Other | Admitting: Physical Therapy

## 2022-01-13 DIAGNOSIS — M47816 Spondylosis without myelopathy or radiculopathy, lumbar region: Secondary | ICD-10-CM | POA: Diagnosis not present

## 2022-01-13 DIAGNOSIS — M545 Low back pain, unspecified: Secondary | ICD-10-CM | POA: Diagnosis not present
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
## 2022-01-20 ENCOUNTER — Ambulatory Visit (HOSPITAL_COMMUNITY): Payer: Medicare Other

## 2022-01-20 DIAGNOSIS — M545 Low back pain, unspecified: Secondary | ICD-10-CM | POA: Diagnosis not present

## 2022-01-20 DIAGNOSIS — M47816 Spondylosis without myelopathy or radiculopathy, lumbar region: Secondary | ICD-10-CM | POA: Diagnosis not present
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
## 2022-01-22 ENCOUNTER — Ambulatory Visit (HOSPITAL_COMMUNITY): Payer: Medicare Other

## 2022-01-22 DIAGNOSIS — M47816 Spondylosis without myelopathy or radiculopathy, lumbar region: Secondary | ICD-10-CM | POA: Diagnosis not present

## 2022-01-22 DIAGNOSIS — M545 Low back pain, unspecified: Secondary | ICD-10-CM
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
## 2022-01-27 ENCOUNTER — Ambulatory Visit (HOSPITAL_COMMUNITY): Payer: Medicare Other | Attending: Physical Medicine and Rehabilitation

## 2022-01-27 DIAGNOSIS — M545 Low back pain, unspecified: Secondary | ICD-10-CM | POA: Insufficient documentation

## 2022-01-27 DIAGNOSIS — M47816 Spondylosis without myelopathy or radiculopathy, lumbar region: Secondary | ICD-10-CM | POA: Diagnosis not present
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
## 2022-01-28 DIAGNOSIS — H53483 Generalized contraction of visual field, bilateral: Secondary | ICD-10-CM | POA: Diagnosis not present

## 2022-01-29 ENCOUNTER — Encounter (HOSPITAL_COMMUNITY): Payer: Medicare Other

## 2022-02-24 ENCOUNTER — Other Ambulatory Visit (HOSPITAL_COMMUNITY): Payer: Self-pay | Admitting: Obstetrics and Gynecology

## 2022-02-24 DIAGNOSIS — Z1231 Encounter for screening mammogram for malignant neoplasm of breast: Secondary | ICD-10-CM

## 2022-03-04 DIAGNOSIS — R209 Unspecified disturbances of skin sensation: Secondary | ICD-10-CM | POA: Diagnosis not present

## 2022-03-04 DIAGNOSIS — M48061 Spinal stenosis, lumbar region without neurogenic claudication: Secondary | ICD-10-CM | POA: Diagnosis not present

## 2022-03-16 ENCOUNTER — Ambulatory Visit: Payer: BLUE CROSS/BLUE SHIELD | Admitting: Podiatry

## 2022-03-30 ENCOUNTER — Ambulatory Visit (HOSPITAL_COMMUNITY)
Admission: RE | Admit: 2022-03-30 | Discharge: 2022-03-30 | Disposition: A | Discharge status: Home / Self Care | Payer: Medicare Other | Source: Ambulatory Visit | Attending: Obstetrics and Gynecology | Admitting: Obstetrics and Gynecology

## 2022-03-30 DIAGNOSIS — Z1231 Encounter for screening mammogram for malignant neoplasm of breast: Secondary | ICD-10-CM | POA: Diagnosis not present

## 2022-04-14 DIAGNOSIS — E559 Vitamin D deficiency, unspecified: Secondary | ICD-10-CM | POA: Diagnosis not present

## 2022-04-14 DIAGNOSIS — I6521 Occlusion and stenosis of right carotid artery: Secondary | ICD-10-CM | POA: Diagnosis not present

## 2022-04-14 DIAGNOSIS — Z79899 Other long term (current) drug therapy: Secondary | ICD-10-CM | POA: Diagnosis not present

## 2022-04-14 DIAGNOSIS — J029 Acute pharyngitis, unspecified: Secondary | ICD-10-CM | POA: Diagnosis not present

## 2022-04-14 DIAGNOSIS — I10 Essential (primary) hypertension: Secondary | ICD-10-CM | POA: Diagnosis not present

## 2022-04-14 DIAGNOSIS — D72819 Decreased white blood cell count, unspecified: Secondary | ICD-10-CM | POA: Diagnosis not present

## 2022-04-14 DIAGNOSIS — R5383 Other fatigue: Secondary | ICD-10-CM | POA: Diagnosis not present

## 2022-04-14 DIAGNOSIS — R509 Fever, unspecified: Secondary | ICD-10-CM | POA: Diagnosis not present

## 2022-04-14 DIAGNOSIS — U071 COVID-19: Secondary | ICD-10-CM | POA: Diagnosis not present

## 2022-04-27 ENCOUNTER — Encounter: Payer: Self-pay | Admitting: Neurology

## 2022-04-27 ENCOUNTER — Ambulatory Visit: Payer: Medicare Other | Admitting: Neurology

## 2022-04-27 VITALS — BP 141/78 | HR 51 | Ht 59.0 in | Wt 147.4 lb

## 2022-04-27 DIAGNOSIS — R200 Anesthesia of skin: Secondary | ICD-10-CM | POA: Diagnosis not present

## 2022-04-27 DIAGNOSIS — R29898 Other symptoms and signs involving the musculoskeletal system: Secondary | ICD-10-CM

## 2022-04-27 DIAGNOSIS — M48061 Spinal stenosis, lumbar region without neurogenic claudication: Secondary | ICD-10-CM | POA: Diagnosis not present

## 2022-04-27 DIAGNOSIS — M48062 Spinal stenosis, lumbar region with neurogenic claudication: Secondary | ICD-10-CM | POA: Diagnosis not present

## 2022-04-27 DIAGNOSIS — G8929 Other chronic pain: Secondary | ICD-10-CM

## 2022-04-27 DIAGNOSIS — M5440 Lumbago with sciatica, unspecified side: Secondary | ICD-10-CM

## 2022-04-27 DIAGNOSIS — M5416 Radiculopathy, lumbar region: Secondary | ICD-10-CM

## 2022-04-27 NOTE — Progress Notes (Addendum)
[facility]    Provider:  Dr Lucia Gaskins Requesting Provider: Danie Chandler, NP Primary Care Provider:  Ileana Ladd, MD (Inactive)  CC:  lumabar stenosis   Addendun 06/22/2022: Please call patient and discuss, she has a lot of arthritis but neither the new MRI of the lumbar spine or the CT Myelogram showed any pinched nerve roots(nothing like the MRI she had several years ago). We can send her for injections to Dr. Lorrine Kin at [facility] but her back pain is due to arthritis and no spinal stenosis or nerves pinched. The injections may help and we are happy to send her thanks.  HPI:  Wendy [name] is a 82 y.o. female here as requested by Danie Chandler, NP for lumbar stenosis. Here with husband who provides information. Had lumbar stenosis and lumbar radiculopathy for years. Cold right foot. Has been to PT for > 3 months, conservative measures, been under the care of doctors for years, failed conservative therapy, MRi from 2014 report states lumbar stenosis and multilevel nerve impingement. Numbness right foot, weakness right foot, pain in the low back, shooting to the foot, pain and numbness right thigh (moderate spinal stenosis l3-l4), right foot numbness (moderate stenosis l4-l5 right lateral disc herniation in the lateral recess imprinching nerves). No other focal neurologic deficits, associated symptoms, inciting events or modifiable factors.   Reviewed notes, labs and imaging from outside physicians, which showed:  MRI of the lumbar spine 12/19/2012: 1 mild to moderate spinal stenosis at L3-L4, worse compared to prior study 2.  Scoliosis convex to the left 3.  Moderate spinal stenosis at L4-L5 with a right lateral disc herniation in the lateral recess and neural foramen which may be impinging on the right L4 nerve root unchanged. For multiple uterine fibroids at least 1 of which is likely submucosal  Discussion multiplanar MRI was performed through the  lumbosacral spine on a 1.5 Tesla GE [employer] MRI system and compared to a prior study that was done September 2008.  There is scoliosis present convex to the left.  Vertebral body height and signals are normal.  The conus terminates at the L1 level and is not enlarged.  There is mild anterior listhesis of L4 and L5, grade 1 which is unchanged.  Disc desiccation and degenerative disc disease is seen in essentially all of the lumbar spines.  There is slight bulging of the disc at L2-L3 but without focal disc herniation or spinal stenosis  At L3-L4 there is bulging of the disc with bilateral facet hypertrophy.  The AP diameter of the sac measures 8 mm and previously measured 10 mm indicating increasing spinal stenosis.  At L4-L5 there is bulging of the disc and there is disc material in the right lateral recess and neural foramen which may be impinging on the right L4 nerve root.  This appears relatively similar compared to the prior study.  There is also moderate spinal stenosis at this level which may be slightly worse compared to the prior study.  L5-S1 level is grossly unremarkable.  The cauda equina are not thickened.  There are multiple fibroids within the uterus.        Latest Ref Rng & Units 10/09/2021    9:56 AM 05/05/2021   11:08 AM 05/10/2020   10:29 AM  CBC  WBC 4.0 - 10.5 K/uL 3.0  3.1  2.2   Hemoglobin 12.0 - 15.0 g/dL 16.1  09.6  04.5   Hematocrit 36.0 - 46.0 % 36.9  37.6  36.1    36.5   Platelets 150 - 400 K/uL 150  178  154       Latest Ref Rng & Units 10/09/2021    9:56 AM 09/20/2018    2:21 PM 01/18/2017   12:57 PM  CMP  Glucose 70 - 99 mg/dL 454  81  84   BUN 8 - 23 mg/dL 22  14  17    Creatinine 0.44 - 1.00 mg/dL 0.98  1.19  1.47   Sodium 135 - 145 mmol/L 137  140  140   Potassium 3.5 - 5.1 mmol/L 3.7  3.6  4.0   Chloride 98 - 111 mmol/L 101  100  102   CO2 22 - 32 mmol/L 27  25  31    Calcium 8.9 - 10.3 mg/dL 9.5  82.9  9.9   Total Protein 6.0 - 8.5 g/dL  7.2  7.1    Total Bilirubin 0.0 - 1.2 mg/dL  0.8  1.3   Alkaline Phos 39 - 117 IU/L  49    AST 0 - 40 IU/L  22  24   ALT 0 - 32 IU/L  20  20      Review of Systems: Patient complains of symptoms per HPI as well as the following symptoms back pain. Pertinent negatives and positives per HPI. All others negative.   Social History   Socioeconomic History   Marital status: Married    Spouse name: Chrissie Noa   Number of children: 3   Years of education: Not on file   Highest education level: Associate degree: academic program  Occupational History   Occupation: retired    Comment: OR Engineer, civil (consulting) - RN  Tobacco Use   Smoking status: Never   Smokeless tobacco: Never  Vaping Use   Vaping Use: Never used  Substance and Sexual Activity   Alcohol use: No   Drug use: No   Sexual activity: Not Currently  Other Topics Concern   Not on file  Social History Narrative   Retired Charity fundraiser   Lives with Husband Chrissie Noa   moved to [place] to be near children   Daughter is Isaiah Serge   Social Determinants of Health   Financial Resource Strain: Low Risk  (05/10/2020)   Overall Financial Resource Strain (CARDIA)    Difficulty of Paying Living Expenses: Not hard at all  Food Insecurity: No Food Insecurity (05/10/2020)   Hunger Vital Sign    Worried About Running Out of Food in the Last Year: Never true    Ran Out of Food in the Last Year: Never true  Transportation Needs: No Transportation Needs (05/10/2020)   PRAPARE - Administrator, Civil Service (Medical): No    Lack of Transportation (Non-Medical): No  Physical Activity: Insufficiently Active (05/10/2020)   Exercise Vital Sign    Days of Exercise per Week: 3 days    Minutes of Exercise per Session: 10 min  Stress: No Stress Concern Present (05/10/2020)   [employer] of Occupational Health - Occupational Stress Questionnaire    Feeling of Stress : Not at all  Social Connections: Socially Integrated (05/10/2020)   Social Connection and Isolation  Panel [NHANES]    Frequency of Communication with Friends and Family: More than three times a week    Frequency of Social Gatherings with Friends and Family: More than three times a week    Attends Religious Services: More than 4 times per year    Active Member of [employer] or Organizations: Yes  Attends Club or Organization Meetings: 1 to 4 times per year    Marital Status: Married  Catering manager Violence: Not At Risk (05/10/2020)   Humiliation, Afraid, Rape, and Kick questionnaire    Fear of Current or Ex-Partner: No    Emotionally Abused: No    Physically Abused: No    Sexually Abused: No    Family History  Problem Relation Age of Onset   COPD Mother    Arthritis Mother    Hearing loss Mother    Hypertension Mother    Stroke Father 67   Diabetes Father    Heart disease Sister    Hyperlipidemia Sister    Hypertension Sister    Heart attack Sister    Cancer Sister        Leukemia   Cancer Brother        prostate   Mental illness Brother        PTSD from war   Thyroid disease Daughter    Diabetes Son    Cancer Maternal Aunt        breast    Past Medical History:  Diagnosis Date   Arthritis    hands, neck lower back   Cataract    extracted   GERD (gastroesophageal reflux disease)    prior med   Hypertension    Lumbar stenosis     Patient Active Problem List   Diagnosis Date Noted   Spinal stenosis of lumbar region with radiculopathy 04/27/2022   Dense breast tissue on mammogram 05/04/2017   H/O mitral valve prolapse 02/18/2017   Essential hypertension 01/18/2017   Chronic GERD 01/18/2017   History of Helicobacter pylori infection 01/18/2017   Mild hyperlipidemia 01/18/2017   Leucopenia 01/18/2017   Environmental allergies 01/18/2017    Past Surgical History:  Procedure Laterality Date   CATARACT EXTRACTION, BILATERAL     SHOULDER ARTHROSCOPY Right    SHOULDER ARTHROTOMY Left    about 2007    Current Outpatient Medications  Medication Sig  Dispense Refill   Ascorbic Acid (VITAMIN C) 500 MG CAPS 2 capsules     aspirin EC 81 MG tablet Take 81 mg by mouth daily.     Glucosamine 500 MG CAPS 1 capsule with a meal     losartan-hydrochlorothiazide (HYZAAR) 50-12.5 MG tablet Take 1 tablet by mouth daily.     Multiple Vitamin (MULTIVITAMIN) tablet Take 1 tablet by mouth daily.     Omega-3 Fatty Acids (FISH OIL OMEGA-3 PO) Take 1 capsule by mouth.     No current facility-administered medications for this visit.    Allergies as of 04/27/2022   (No Known Allergies)    Vitals: BP (!) 141/78   Pulse (!) 51   Ht 4\' 11"  (1.499 m)   Wt 147 lb 6.4 oz (66.9 kg)   BMI 29.77 kg/m  Last Weight:  Wt Readings from Last 1 Encounters:  04/27/22 147 lb 6.4 oz (66.9 kg)   Last Height:   Ht Readings from Last 1 Encounters:  04/27/22 4\' 11"  (1.499 m)     Physical exam: Exam: Gen: NAD, conversant, well nourised, well groomed                     CV: +SEM, bradycardic. No Carotid Bruits. No peripheral edema, warm, nontender Eyes: Conjunctivae clear without exudates or hemorrhage  Neuro: Detailed Neurologic Exam  Speech:    Speech is normal; fluent and spontaneous with normal comprehension.  Cognition:    The  patient is oriented to person, place, and time;     recent and remote memory intact;     language fluent;     normal attention, concentration,     fund of knowledge Cranial Nerves:    The pupils are equal, round, and reactive to light.  Funduscopy could not visualize due to small pupils.  Extraocular movements are intact. Trigeminal sensation is intact and the muscles of mastication are normal. The face is symmetric. The palate elevates in the midline. Hearing intact. Voice is normal. Shoulder shrug is normal. The tongue has normal motion without fasciculations.   Coordination: nnl  Gait: Nml, weakness left heel walk  Motor Observation: No asymmetry, no atrophy, and no involuntary movements noted. Tone:    Normal muscle  tone.    Posture:    Posture is normal. normal erect    Strength: weakness prox right leg, leg flexion, weakness dorsiflexion right leg. Left leg     Strength is V/V in the upper and left lower limbs.      Sensation: numbness right dosal toes     Reflex Exam:  DTR's:    Deep tendon reflexes in the upper and lower extremities are symmetrical bilaterally.   Toes:    The toes are downgoing bilaterally.   Clonus:    Clonus is absent.    Assessment/Plan:  82 y.o. female here as requested by Danie Chandler, NP for lumbar stenosis. Here with husband who provides information. Had lumbar stenosis and lumbar radiculopathy for years. Cold right foot. Has been to PT for > 3 months, conservative measures, been under the care of doctors for years, failed conservative therapy, MRi from 2014 report states lumbar lumbar stenosis and multilevel nerve impingement. Numbness right foot, weakness right foot, pain in the low back, shooting to the foot, pain and numbness right thigh (moderate spinal stenosis l3-l4), right foot numbness (moderate stenosis l4-l5 right lateral disc herniation in the lateral recess imprinching nerves). No other focal neurologic deficits, associated symptoms, inciting events or modifiable factors.  MRI of the lumbar spine 12/19/2012: 1 mild to moderate spinal stenosis at L3-L4, worse compared to prior study 2.  Scoliosis convex to the left 3.  Moderate spinal stenosis at L4-L5 with a right lateral disc herniation in the lateral recess and neural foramen which may be impinging on the right L4 nerve root unchanged. For multiple uterine fibroids at least 1 of which is likely submucosal  Addendum: New MRI Lumbar spine 04/2022 does NOT show nearly the same severity as the 2014 MRi above nd neither does a CT Myelogram  On sagittal views the vertebral bodies have normal height and alignment; 5 mm of anterior spondylolisthesis of L4 and L5.  Degenerative spondylosis and disc bulging at  L1-2 down to L5-S1.  The conus medullaris terminates at the level of L1.     On axial views:  T12-L1: no spinal stenosis or foraminal narrowing  L1-2: no spinal stenosis or foraminal narrowing  L2-3: disc bulging with no spinal stenosis or foraminal narrowing  L3-4: disc bulging and facet hypertrophy with no spinal stenosis or foraminal narrowing  L4-5: pseudo-disc bulging and facet hypertrophy with mild spinal stenosis and mild biforaminal stenosis  L5-S1: facet hypertrophy with no spinal stenosis or foraminal narrowing    Limited views of the aorta, kidneys, iliopsoas muscles and sacroiliac joints are unremarkable.  Lumbar paraspinal muscle atrophy noted.   IMPRESSION:    MRI lumbar spine (without) demonstrating: - At L4-5: pseudo-disc bulging and  facet hypertrophy with mild spinal stenosis and mild biforaminal stenosis. - Mild additional degenerative changes and disc bulging from L1 to down to L5-S1 as above.   CT MYELOGRAM:  TECHNIQUE: The procedure, risks (including but not limited to bleeding, infection, organ damage ), benefits, and alternatives were explained to the patient. Questions regarding the procedure were encouraged and answered. The patient understands and consents to the procedure. An appropriate entry site was determined under fluoroscopy. Operator donned sterile gloves and mask. Skin site was marked, prepped with Betadine, and draped in usual sterile fashion, and infiltrated locally with 1% lidocaine. A 22 gauge spinal needle was advanced into the thecal sac at L3-4 from a left parasagittal approach. Clear colorless CSF returned. 17 ml Omnipaque 180 were administered intrathecally for lumbar myelography, followed by axial CT scanning of the lumbar spine. I personally performed the lumbar puncture and administered the intrathecal contrast. I also personally supervised acquisition of the myelogram images. Coronal and sagittal reconstructions were generated from  the axial scan. This exam was performed according to the departmental dose-optimization program which includes automated exposure control, adjustment of the mA and/or kV according to patient size and/or use of iterative reconstruction technique.   COMPARISON:  MR 05/12/2022   FINDINGS: Number scheme reflects values on previous MR, the transitional lumbosacral segment assigned L5. No fracture or worrisome bone lesion.   T11-12: Incompletely visualized, with a mild broad posterior disc bulge. No spinal or foraminal stenosis.   T12-L1: Interspace unremarkable. No spinal or foraminal stenosis. Conus terminates behind L1.   L1-2: Mild left foraminal disc bulge. No spinal or foraminal stenosis. Minimal bilateral facet degenerative spurring.   L2-3: Minimal vacuum phenomenon in the interspace. Mild broad posterior and left lateral disc bulge. Mild bilateral facet DJD with some thickening of ligamentum flavum. No spinal or foraminal stenosis.   L3-4: Early vacuum phenomenon in the interspace. Mild broad posterior and left lateral disc bulge. Moderate bilateral facet DJD with some thickening of ligamentum flavum with mild narrowing of the thecal sac, left foraminal encroachment.   L4-5: Central vacuum phenomena. Advanced facet DJD bilaterally allowing grade 1 anterolisthesis. Mild broad posterior disc bulge without significant spinal stenosis. Right greater than left foraminal encroachment.   L5-S1: Mild right facet DJD. No significant disc bulge or protrusion. Central canal and foramina patent.   No paraspinal mass or hematoma. Minimal calcifications in the abdominal aorta without aneurysm. Remainder visualized paraspinal soft tissues unremarkable.   IMPRESSION: 1. Mild left foraminal disc bulge L1-2 without spinal or foraminal stenosis. 2. Mild disc bulge L2-3 without spinal or foraminal stenosis. 3. Mild disc bulge L3-4 with mild spinal stenosis, left  foraminal encroachment. 4. Advanced facet DJD L4-5 with grade 1 anterolisthesis, right greater than left foraminal encroachment.   Orders Placed This Encounter  Procedures   MR LUMBAR SPINE WO CONTRAST   No orders of the defined types were placed in this encounter.   Cc: Danie Chandler, NP,  Ileana Ladd, MD (Inactive)  Naomie Dean, MD  [facility] [address] [place], [place] [address]  Phone [phone] Fax [phone]

## 2022-04-27 NOTE — Patient Instructions (Addendum)
Repeat MRi lumbar spine Send or injections ns physical therapy vs surgery    Radicular Pain Radicular pain is a type of pain that spreads from your back or neck along a spinal nerve. Spinal nerves are nerves that leave the spinal cord and go to the muscles. Radicular pain is sometimes called radiculopathy, radiculitis, or a pinched nerve. When you have this type of pain, you may also have weakness, numbness, or tingling in the area of your body that is supplied by the nerve. The pain may feel sharp and burning. Depending on which spinal nerve is affected, the pain may occur in the: Neck area (cervical radicular pain). You may also feel pain, numbness, weakness, or tingling in the arms. Mid-spine area (thoracic radicular pain). You would feel this pain in the back and chest. This type is rare. Lower back area (lumbar radicular pain). You would feel this pain as low back pain. You may feel pain, numbness, weakness, or tingling in the buttocks or legs. Sciatica is a type of lumbar radicular pain that shoots down the back of the leg. Radicular pain occurs when one of the spinal nerves becomes irritated or squeezed (compressed). It is often caused by something pushing on a spinal nerve, such as one of the bones of the spine (vertebrae) or one of the round cushions between vertebrae (intervertebral disks). This can result from: An injury. Wear and tear or aging of a disk. The growth of a bone spur that pushes on the nerve. Radicular pain often goes away when you follow instructions from your health care provider for relieving pain at home. How is this treated? Treatment may depend on the cause of the condition and may include: Working with a physical therapist. Taking pain medicine. Applying heat or ice or both to the affected areas. Doing stretches to improve flexibility. Having surgery. This may be needed if other treatments do not help. Different types of surgery may be done depending on the cause  of this condition. Follow these instructions at home: Managing pain     If directed, put ice on the affected area. To do this: Put ice in a plastic bag. Place a towel between your skin and the bag. Leave the ice on for 20 minutes, 2-3 times a day. Remove the ice if your skin turns bright red. This is very important. If you cannot feel pain, heat, or cold, you have a greater risk of damage to the area. If directed, apply heat to the affected area as often as told by your health care provider. Use the heat source that your health care provider recommends, such as a moist heat pack or a heating pad. Place a towel between your skin and the heat source. Leave the heat on for 20-30 minutes. Remove the heat if your skin turns bright red. This is especially important if you are unable to feel pain, heat, or cold. You have a greater risk of getting burned. Activity Do not sit or rest in bed for long periods of time. Try to stay as active as possible. Ask your health care provider what type of exercise or activity is best for you. Avoid activities that make your pain worse, such as bending and lifting. You may have to avoid lifting. Ask your health care provider how much you can safely lift. Practice using proper technique when lifting items. Proper lifting technique involves bending your knees and rising up. Do strength and range-of-motion exercises only as told by your health care provider  or physical therapist. General instructions Take over-the-counter and prescription medicines only as told by your health care provider. Pay attention to any changes in your symptoms. Keep all follow-up visits. This is important. Contact a health care provider if: Your pain and other symptoms get worse. Your pain medicine is not helping. Your pain has not improved after a few weeks of home care. You have a fever. Get help right away if: You have severe pain, weakness, or numbness. You have difficulty with  bladder or bowel control. Summary Radicular pain is a type of pain that spreads from your back or neck along a spinal nerve. When you have radicular pain, you may also have weakness, numbness, or tingling in the area of your body that is supplied by the nerve. The pain may feel sharp or burning. Radicular pain may be treated with ice, heat, medicines, or physical therapy. This information is not intended to replace advice given to you by your health care provider. Make sure you discuss any questions you have with your health care provider. Document Revised: 08/15/2020 Document Reviewed: 08/15/2020 Elsevier Patient Education  [place].

## 2022-04-28 ENCOUNTER — Telehealth: Payer: Self-pay | Admitting: Neurology

## 2022-04-28 NOTE — Telephone Encounter (Signed)
[employer] medicare [employer]: Prior Josem Kaufmann is not required for [facility] Medicare members between April 24, 2022, and May 24, 2022. Please check back after 05/24/22 for any future requests. sent to [facility] [phone]

## 2022-05-11 DIAGNOSIS — Z6831 Body mass index (BMI) 31.0-31.9, adult: Secondary | ICD-10-CM | POA: Diagnosis not present

## 2022-05-11 DIAGNOSIS — Z1389 Encounter for screening for other disorder: Secondary | ICD-10-CM | POA: Diagnosis not present

## 2022-05-11 DIAGNOSIS — Z0000 Encounter for general adult medical examination without abnormal findings: Secondary | ICD-10-CM | POA: Diagnosis not present

## 2022-05-12 ENCOUNTER — Ambulatory Visit
Admission: RE | Admit: 2022-05-12 | Discharge: 2022-05-12 | Disposition: A | Discharge status: Home / Self Care | Payer: Medicare Other | Source: Ambulatory Visit | Attending: Neurology | Admitting: Neurology

## 2022-05-12 DIAGNOSIS — M48062 Spinal stenosis, lumbar region with neurogenic claudication: Secondary | ICD-10-CM

## 2022-05-12 DIAGNOSIS — G8929 Other chronic pain: Secondary | ICD-10-CM

## 2022-05-12 DIAGNOSIS — R200 Anesthesia of skin: Secondary | ICD-10-CM

## 2022-05-12 DIAGNOSIS — M48061 Spinal stenosis, lumbar region without neurogenic claudication: Secondary | ICD-10-CM | POA: Diagnosis not present

## 2022-05-12 DIAGNOSIS — R29898 Other symptoms and signs involving the musculoskeletal system: Secondary | ICD-10-CM | POA: Diagnosis not present

## 2022-05-12 DIAGNOSIS — M5416 Radiculopathy, lumbar region: Secondary | ICD-10-CM | POA: Diagnosis not present

## 2022-05-13 ENCOUNTER — Inpatient Hospital Stay: Payer: Medicare Other | Attending: Hematology

## 2022-05-13 DIAGNOSIS — D72819 Decreased white blood cell count, unspecified: Secondary | ICD-10-CM | POA: Diagnosis not present

## 2022-05-13 DIAGNOSIS — Z79899 Other long term (current) drug therapy: Secondary | ICD-10-CM | POA: Insufficient documentation

## 2022-05-13 DIAGNOSIS — D696 Thrombocytopenia, unspecified: Secondary | ICD-10-CM | POA: Diagnosis not present

## 2022-05-13 DIAGNOSIS — R9230 Dense breasts, unspecified: Secondary | ICD-10-CM

## 2022-05-13 LAB — CBC WITH DIFFERENTIAL/PLATELET
Abs Immature Granulocytes: 0 10*3/uL (ref 0.00–0.07)
Basophils Absolute: 0 10*3/uL (ref 0.0–0.1)
Basophils Relative: 1 %
Eosinophils Absolute: 0.1 10*3/uL (ref 0.0–0.5)
Eosinophils Relative: 5 %
HCT: 38.1 % (ref 36.0–46.0)
Hemoglobin: 12.9 g/dL (ref 12.0–15.0)
Immature Granulocytes: 0 %
Lymphocytes Relative: 42 %
Lymphs Abs: 1 10*3/uL (ref 0.7–4.0)
MCH: 30 pg (ref 26.0–34.0)
MCHC: 33.9 g/dL (ref 30.0–36.0)
MCV: 88.6 fL (ref 80.0–100.0)
Monocytes Absolute: 0.4 10*3/uL (ref 0.1–1.0)
Monocytes Relative: 18 %
Neutro Abs: 0.8 10*3/uL — ABNORMAL LOW (ref 1.7–7.7)
Neutrophils Relative %: 34 %
Platelets: 141 10*3/uL — ABNORMAL LOW (ref 150–400)
RBC: 4.3 MIL/uL (ref 3.87–5.11)
RDW: 12.1 % (ref 11.5–15.5)
WBC: 2.2 10*3/uL — ABNORMAL LOW (ref 4.0–10.5)
nRBC: 0 % (ref 0.0–0.2)

## 2022-05-13 LAB — VITAMIN B12: Vitamin B-12: 637 pg/mL (ref 180–914)

## 2022-05-13 LAB — LACTATE DEHYDROGENASE: LDH: 160 U/L (ref 98–192)

## 2022-05-13 LAB — FOLATE: Folate: >40 ng/mL (ref >5.9)

## 2022-05-14 ENCOUNTER — Other Ambulatory Visit: Payer: BLUE CROSS/BLUE SHIELD

## 2022-05-19 ENCOUNTER — Telehealth: Payer: Self-pay | Admitting: Neurology

## 2022-05-19 DIAGNOSIS — M79604 Pain in right leg: Secondary | ICD-10-CM

## 2022-05-19 NOTE — Telephone Encounter (Signed)
Discuss with patient: New MRI Lumbar spine 04/2022 does NOT show nearly the same severity as the 2014 MRI. The new MRI doesn't show any significant lumbar stenosis or pinched nerves. Please inform patient we likely need more testing such as as emg/ncs if she is willing. Did she get lumbar surgery at some point between 2014 and now to explain the improvement in imaging? If she agrees to emg/ncs please order it and we will call her to schedule thanks    IMPRESSION 04/2022:    MRI lumbar spine (without) demonstrating: - At L4-5: pseudo-disc bulging and facet hypertrophy with mild spinal stenosis and mild biforaminal stenosis. - Mild additional degenerative changes and disc bulging from L1 to down to L5-S1 as above.   IMPRESSION 11/2012: (will try to get the images from here)  MRI of the lumbar spine 12/19/2012: 1 mild to moderate spinal stenosis at L3-L4, worse compared to prior study 2.  Scoliosis convex to the left 3.  Moderate spinal stenosis at L4-L5 with a right lateral disc herniation in the lateral recess and neural foramen which may be impinging on the right L4 nerve root unchanged. For multiple uterine fibroids at least 1 of which is likely submucosal

## 2022-05-19 NOTE — Progress Notes (Unsigned)
Wendy [name], [place] [address]   CLINIC:  Medical Oncology/Hematology  PCP:  Vernie Shanks, MD (Inactive) No address on file None   REASON FOR VISIT:  Follow-up for leukopenia  PRIOR THERAPY: None  CURRENT THERAPY: Surveillance  INTERVAL HISTORY:   Wendy [name] 82 y.o. female returns for routine follow-up of leukopenia.  She was last seen by Dr. Delton Coombes on 05/12/2021.  At today's visit, she reports feeling fairly well.  No recent hospitalizations, surgeries, or changes in baseline health status.  She reports COVID-19 infection in February 2024, but denies any other recent or recurrent infections.  She denies any B symptoms, abnormal rashes, masses, or lymphadenopathy.  She has 85% energy and 100% appetite. She endorses that she is maintaining a stable weight.   ASSESSMENT & PLAN:  1.  Chronic leukopenia - Has been following with hematology since 1999. - BMBx in 1999 suggested dysplasia with some refractory anemia, although her hemoglobin was quite normal.  She was reportedly given Neupogen injections prior to surgical procedures. - She then saw a hematologist in [place] and had another bone marrow biopsy which showed no evidence of dysplasia, normocellular marrow, and normal cytogenetics. - She was thought to have benign neutropenia. - She denies any B symptoms.  COVID infection in February 2024, otherwise no recent or recurrent infections. - No palpable adenopathy.   No signs or symptoms of connective tissue disorder.   - Most recent labs (05/13/2022): WBC 2.2/ANC 0.8, platelets 141.  Normal LDH.  Normal B12 and folate. - PLAN: Due to decreasing WBC/ANC, as well as mild thrombocytopenia, we will recheck labs in 6 months rather than waiting until next year.  We will check CBC/D, CMP, LDH, B12, MMA, folate, copper.  Will also check MGUS screening panel.  2.  Other history - Family history includes a sister deceased from unspecified acute  leukemia in her early 90's   -- She has three children and several grandchildren.  She has been married to her husband for 60+ years.  She is a retired Geophysicist/field seismologist.  PLAN SUMMARY: >> Labs in 6 months = CBC/D, CMP, B12, MMA, folate, copper, SPEP, light chains, immunofixation >> OFFICE visit in 6 months (1 week after labs)     REVIEW OF SYSTEMS:   Review of Systems  Constitutional:  Negative for appetite change, chills, diaphoresis, fatigue, fever and unexpected weight change.  HENT:   Negative for lump/mass and nosebleeds.   Eyes:  Negative for eye problems.  Respiratory:  Negative for cough, hemoptysis and shortness of breath.   Cardiovascular:  Negative for chest pain, leg swelling and palpitations.  Gastrointestinal:  Negative for abdominal pain, blood in stool, constipation, diarrhea, nausea and vomiting.  Genitourinary:  Negative for hematuria.   Skin: Negative.   Neurological:  Positive for headaches. Negative for dizziness and light-headedness.  Hematological:  Does not bruise/bleed easily.     PHYSICAL EXAM:  ECOG PERFORMANCE STATUS: 0 - Asymptomatic  There were no vitals filed for this visit. There were no vitals filed for this visit. Physical Exam Constitutional:      Appearance: Normal appearance. She is normal weight.  Cardiovascular:     Heart sounds: Normal heart sounds.  Pulmonary:     Breath sounds: Normal breath sounds.  Lymphadenopathy:     Head:     Right side of head: No submental, submandibular, tonsillar, preauricular, posterior auricular or occipital adenopathy.     Left side of head:  No submental, submandibular, tonsillar, preauricular, posterior auricular or occipital adenopathy.     Cervical: No cervical adenopathy.  Neurological:     General: No focal deficit present.     Mental Status: Mental status is at baseline.  Psychiatric:        Behavior: Behavior normal. Behavior is cooperative.     PAST MEDICAL/SURGICAL HISTORY:  Past Medical  History:  Diagnosis Date   Arthritis    hands, neck lower back   Cataract    extracted   GERD (gastroesophageal reflux disease)    prior med   Hypertension    Lumbar stenosis    Past Surgical History:  Procedure Laterality Date   CATARACT EXTRACTION, BILATERAL     SHOULDER ARTHROSCOPY Right    SHOULDER ARTHROTOMY Left    about 2007    SOCIAL HISTORY:  Social History   Socioeconomic History   Marital status: Married    Spouse name: Gwyndolyn Saxon   Number of children: 3   Years of education: Not on file   Highest education level: Associate degree: academic program  Occupational History   Occupation: retired    Comment: OR Marine scientist - RN  Tobacco Use   Smoking status: Never   Smokeless tobacco: Never  Vaping Use   Vaping Use: Never used  Substance and Sexual Activity   Alcohol use: No   Drug use: No   Sexual activity: Not Currently  Other Topics Concern   Not on file  Social History Narrative   Retired Therapist, sports   Lives with Husband Gwyndolyn Saxon   moved to [place] to be near children   Daughter is Esmond Harps   Social Determinants of Health   Financial Resource Strain: [place]  (05/10/2020)   Overall Financial Resource Strain (CARDIA)    Difficulty of Paying Living Expenses: Not hard at all  Food Insecurity: No Food Insecurity (05/10/2020)   Hunger Vital Sign    Worried About Running Out of Food in the Last Year: Never true    [place] in the Last Year: Never true  Transportation Needs: No Transportation Needs (05/10/2020)   PRAPARE - Hydrologist (Medical): No    Lack of Transportation (Non-Medical): No  Physical Activity: Insufficiently Active (05/10/2020)   Exercise Vital Sign    Days of Exercise per Week: 3 days    Minutes of Exercise per Session: 10 min  Stress: No Stress Concern Present (05/10/2020)   [place]    Feeling of Stress : Not at all  Social Connections:  [place] (05/10/2020)   Social Connection and Isolation Panel [NHANES]    Frequency of Communication with Friends and Family: More than three times a week    Frequency of Social Gatherings with Friends and Family: More than three times a week    Attends Religious Services: More than 4 times per year    Active Member of [employer] or Organizations: Yes    Attends Archivist Meetings: 1 to 4 times per year    Marital Status: Married  Human resources officer Violence: Not At Risk (05/10/2020)   Humiliation, Afraid, Rape, and Kick questionnaire    Fear of Current or Ex-Partner: No    Emotionally Abused: No    Physically Abused: No    Sexually Abused: No    FAMILY HISTORY:  Family History  Problem Relation Age of Onset   COPD Mother    Arthritis Mother  Hearing loss Mother    Hypertension Mother    Stroke Father 34   Diabetes Father    Heart disease Sister    Hyperlipidemia Sister    Hypertension Sister    Heart attack Sister    Cancer Sister        Leukemia   Cancer Brother        prostate   Mental illness Brother        PTSD from war   Thyroid disease Daughter    Diabetes Son    Cancer Maternal Aunt        breast    CURRENT MEDICATIONS:  Outpatient Encounter Medications as of 05/20/2022  Medication Sig   Ascorbic Acid (VITAMIN C) 500 MG CAPS 2 capsules   aspirin EC 81 MG tablet Take 81 mg by mouth daily.   Glucosamine 500 MG CAPS 1 capsule with a meal   losartan-hydrochlorothiazide (HYZAAR) 50-12.5 MG tablet Take 1 tablet by mouth daily.   Multiple Vitamin (MULTIVITAMIN) tablet Take 1 tablet by mouth daily.   Omega-3 Fatty Acids (FISH OIL OMEGA-3 PO) Take 1 capsule by mouth.   No facility-administered encounter medications on file as of 05/20/2022.    ALLERGIES:  No Known Allergies  LABORATORY DATA:  I have reviewed the labs as listed.  CBC    Component Value Date/Time   WBC 2.2 (L) 05/13/2022 1102   RBC 4.30 05/13/2022 1102   HGB 12.9 05/13/2022  1102   HGB 12.9 09/20/2018 1421   HCT 38.1 05/13/2022 1102   HCT 36.5 05/10/2020 1029   PLT 141 (L) 05/13/2022 1102   PLT 181 09/20/2018 1421   MCV 88.6 05/13/2022 1102   MCV 88 09/20/2018 1421   MCH 30.0 05/13/2022 1102   MCHC 33.9 05/13/2022 1102   RDW 12.1 05/13/2022 1102   RDW 12.1 09/20/2018 1421   LYMPHSABS 1.0 05/13/2022 1102   LYMPHSABS 1.1 09/20/2018 1421   MONOABS 0.4 05/13/2022 1102   EOSABS 0.1 05/13/2022 1102   EOSABS 0.1 09/20/2018 1421   BASOSABS 0.0 05/13/2022 1102   BASOSABS 0.0 09/20/2018 1421      Latest Ref Rng & Units 10/09/2021    9:56 AM 09/20/2018    2:21 PM 01/18/2017   12:57 PM  CMP  Glucose 70 - 99 mg/dL 111  81  84   BUN 8 - 23 mg/dL 22  14  17    Creatinine 0.44 - 1.00 mg/dL 1.13  1.01  0.88   Sodium 135 - 145 mmol/L 137  140  140   Potassium 3.5 - 5.1 mmol/L 3.7  3.6  4.0   Chloride 98 - 111 mmol/L 101  100  102   CO2 22 - 32 mmol/L 27  25  31    Calcium 8.9 - 10.3 mg/dL 9.5  10.0  9.9   Total Protein 6.0 - 8.5 g/dL  7.2  7.1   Total Bilirubin 0.0 - 1.2 mg/dL  0.8  1.3   Alkaline Phos 39 - 117 IU/L  49    AST 0 - 40 IU/L  22  24   ALT 0 - 32 IU/L  20  20     DIAGNOSTIC IMAGING:  I have independently reviewed the relevant imaging and discussed with the patient.   WRAP UP:  All questions were answered. The patient knows to call the clinic with any problems, questions or concerns.  Medical decision making: Low  Time spent on visit: I spent 15 minutes counseling the patient face  to face. The total time spent in the appointment was 22 minutes and more than 50% was on counseling.  Harriett Rush, PA-C  05/20/2022 11:23 AM

## 2022-05-20 ENCOUNTER — Inpatient Hospital Stay: Payer: Medicare Other | Admitting: Physician Assistant

## 2022-05-20 ENCOUNTER — Other Ambulatory Visit: Payer: Self-pay

## 2022-05-20 VITALS — BP 138/73 | HR 55 | Temp 98.9°F | Resp 18 | Ht <= 58 in | Wt 144.3 lb

## 2022-05-20 DIAGNOSIS — Z79899 Other long term (current) drug therapy: Secondary | ICD-10-CM | POA: Diagnosis not present

## 2022-05-20 DIAGNOSIS — D72819 Decreased white blood cell count, unspecified: Secondary | ICD-10-CM | POA: Diagnosis not present

## 2022-05-20 DIAGNOSIS — D696 Thrombocytopenia, unspecified: Secondary | ICD-10-CM | POA: Diagnosis not present

## 2022-05-20 DIAGNOSIS — D708 Other neutropenia: Secondary | ICD-10-CM

## 2022-05-20 NOTE — Patient Instructions (Addendum)
Wendy [name] at [facility] **   You were seen today by Tarri Abernethy PA-C for your low white blood cells.   Your white blood cells are slightly lower than your normal baseline.  Your platelets are also slightly low. We will recheck labs and see you for follow-up visit in 6 months.  FOLLOW-UP APPOINTMENT: Office visit in 6 months (after labs)  ** Thank you for trusting me with your healthcare!  I strive to provide all of my patients with quality care at each visit.  If you receive a survey for this visit, I would be so grateful to you for taking the time to provide feedback.  Thank you in advance!  ~ Joann Jorge                   Dr. Derek Jack   &   Tarri Abernethy, PA-C   - - - - - - - - - - - - - - - - - -    Thank you for choosing [facility] at [facility] to provide your oncology and hematology care.  To afford each patient quality time with our provider, please arrive at least 15 minutes before your scheduled appointment time.   If you have a lab appointment with the [facility] please come in thru the Main Entrance and check in at the main information desk.  You need to re-schedule your appointment should you arrive 10 or more minutes late.  We strive to give you quality time with our providers, and arriving late affects you and other patients whose appointments are after yours.  Also, if you no show three or more times for appointments you may be dismissed from the clinic at the providers discretion.     Again, thank you for choosing [facility].  Our hope is that these requests will decrease the amount of time that you wait before being seen by our physicians.       _____________________________________________________________  Should you have questions after your visit to [facility], please contact our office at [phone] and follow the prompts.  Our  office hours are 8:00 a.m. and 4:30 p.m. Monday - Friday.  Please note that voicemails left after 4:00 p.m. may not be returned until the following business day.  We are closed weekends and major holidays.  You do have access to a nurse 24-7, just call the main number to the clinic [phone] and do not press any options, hold on the line and a nurse will answer the phone.    For prescription refill requests, have your pharmacy contact our office and allow 72 hours.

## 2022-05-20 NOTE — Telephone Encounter (Addendum)
I called pt and relayed the message below from Dr. Jaynee Eagles.  She stated that she did not have surgery.  She did have steroid injections in her lumbar before.  She is ok to proceed with EMG/NCS.  I relayed that they will call to set up appt for this.  She does stretches/exercises when she is able to do.  Order started.

## 2022-05-20 NOTE — Addendum Note (Signed)
Addended by: Brandon Melnick on: 05/20/2022 02:27 PM   Modules accepted: Orders

## 2022-05-25 NOTE — Addendum Note (Signed)
Addended by: Sarina Ill B on: 05/25/2022 07:05 PM   Modules accepted: Orders

## 2022-05-25 NOTE — Telephone Encounter (Signed)
Thank you, I would also like to order a CT Myelogram. They inject fluid into the low back and see how it diffuses around the nerves and can be more helpful than an MRI. If she is ok with that let me know and I can order thanks

## 2022-05-26 NOTE — Telephone Encounter (Signed)
Spoke with patient. She is amenable to proceeding with CT myelogram. She will await a call for scheduling once insurance auth process has been completed.

## 2022-06-01 NOTE — Addendum Note (Signed)
Addended by: Naomie Dean B on: 06/01/2022 05:14 PM   Modules accepted: Orders

## 2022-06-01 NOTE — Telephone Encounter (Signed)
Done, thanks

## 2022-06-02 ENCOUNTER — Telehealth: Payer: Self-pay | Admitting: Neurology

## 2022-06-02 NOTE — Telephone Encounter (Signed)
[employer] medicare Berkley Harvey: [id number] exp. 06/02/22-07/01/22 sent to GI [phone]

## 2022-06-11 DIAGNOSIS — Z01419 Encounter for gynecological examination (general) (routine) without abnormal findings: Secondary | ICD-10-CM | POA: Diagnosis not present

## 2022-06-15 ENCOUNTER — Other Ambulatory Visit: Payer: Medicare Other

## 2022-06-17 ENCOUNTER — Ambulatory Visit
Admission: RE | Admit: 2022-06-17 | Discharge: 2022-06-17 | Disposition: A | Discharge status: Home / Self Care | Payer: Medicare Other | Source: Ambulatory Visit | Attending: Neurology | Admitting: Neurology

## 2022-06-17 DIAGNOSIS — M5126 Other intervertebral disc displacement, lumbar region: Secondary | ICD-10-CM | POA: Diagnosis not present

## 2022-06-17 DIAGNOSIS — M48061 Spinal stenosis, lumbar region without neurogenic claudication: Secondary | ICD-10-CM | POA: Diagnosis not present

## 2022-06-17 DIAGNOSIS — R29898 Other symptoms and signs involving the musculoskeletal system: Secondary | ICD-10-CM

## 2022-06-17 DIAGNOSIS — R200 Anesthesia of skin: Secondary | ICD-10-CM

## 2022-06-17 DIAGNOSIS — G8929 Other chronic pain: Secondary | ICD-10-CM

## 2022-06-17 DIAGNOSIS — M5416 Radiculopathy, lumbar region: Secondary | ICD-10-CM

## 2022-06-17 DIAGNOSIS — M48062 Spinal stenosis, lumbar region with neurogenic claudication: Secondary | ICD-10-CM

## 2022-06-17 MED ORDER — DIAZEPAM 5 MG PO TABS
5.0000 mg | ORAL_TABLET | Freq: Once | ORAL | Status: AC
  Administered 2022-06-17: 5 mg via ORAL

## 2022-06-17 MED ORDER — ONDANSETRON HCL 4 MG/2ML IJ SOLN: 4.0000 mg | Freq: Once | INTRAMUSCULAR | Status: DC | PRN

## 2022-06-17 MED ORDER — MEPERIDINE HCL 50 MG/ML IJ SOLN: 50.0000 mg | Freq: Once | INTRAMUSCULAR | Status: DC | PRN

## 2022-06-17 MED ORDER — IOPAMIDOL (ISOVUE-M 200) INJECTION 41%
20.0000 mL | Freq: Once | INTRAMUSCULAR | Status: AC
  Administered 2022-06-17: 20 mL via INTRATHECAL

## 2022-06-17 NOTE — Discharge Instructions (Signed)

## 2022-06-23 ENCOUNTER — Telehealth: Payer: Self-pay | Admitting: Neurology

## 2022-06-23 ENCOUNTER — Telehealth: Payer: Self-pay | Admitting: *Deleted

## 2022-06-23 DIAGNOSIS — M5136 Other intervertebral disc degeneration, lumbar region: Secondary | ICD-10-CM

## 2022-06-23 NOTE — Telephone Encounter (Signed)
-----   Message from Anson Fret, MD sent at 06/22/2022  1:36 PM EDT ----- Please call patient and discuss, she has a lot of arthritis but neither the MRI of the lumbar spine or the CT Myelogram showed any pinched nerve roots. We can send her for injections to Dr. Lorrine Kin at [facility] but her back pain is due to arthritis and no spinal stenosis or nerves pinched. The injections may help and we are happy to send her thanks.

## 2022-06-23 NOTE — Telephone Encounter (Signed)
Pt calling to follow-up on results from myelogram.

## 2022-06-23 NOTE — Telephone Encounter (Signed)
See result notes. 

## 2022-06-23 NOTE — Telephone Encounter (Signed)
I called pt and relayed the results of her imaging studies (per below).  Pt verbalized understanding.  Ok to proceed with referral to [facility] for injections.  I gave her # and she will call after 3 days to follow up.  Appreciated call back.  She asked about her foot, (cool R foot), I relayed that she has /EMG still scheduled and may address this.  If there is anything more or change in the plan will contact her.  She verbalized understanding.

## 2022-06-25 ENCOUNTER — Telehealth: Payer: Self-pay | Admitting: Neurology

## 2022-06-25 NOTE — Telephone Encounter (Signed)
Sent to [facility]: Phone [phone] Fax: [phone]

## 2022-07-03 DIAGNOSIS — H43393 Other vitreous opacities, bilateral: Secondary | ICD-10-CM | POA: Diagnosis not present

## 2022-07-03 DIAGNOSIS — H0288B Meibomian gland dysfunction left eye, upper and lower eyelids: Secondary | ICD-10-CM | POA: Diagnosis not present

## 2022-07-03 DIAGNOSIS — H04123 Dry eye syndrome of bilateral lacrimal glands: Secondary | ICD-10-CM | POA: Diagnosis not present

## 2022-07-03 DIAGNOSIS — H0288A Meibomian gland dysfunction right eye, upper and lower eyelids: Secondary | ICD-10-CM | POA: Diagnosis not present

## 2022-07-30 DIAGNOSIS — M47816 Spondylosis without myelopathy or radiculopathy, lumbar region: Secondary | ICD-10-CM | POA: Diagnosis not present

## 2022-07-30 DIAGNOSIS — Z6829 Body mass index (BMI) 29.0-29.9, adult: Secondary | ICD-10-CM | POA: Diagnosis not present

## 2022-08-03 DIAGNOSIS — H903 Sensorineural hearing loss, bilateral: Secondary | ICD-10-CM | POA: Diagnosis not present

## 2022-08-20 ENCOUNTER — Encounter: Payer: Medicare Other | Admitting: Neurology

## 2022-08-20 ENCOUNTER — Ambulatory Visit (INDEPENDENT_AMBULATORY_CARE_PROVIDER_SITE_OTHER): Payer: Medicare Other | Admitting: Neurology

## 2022-08-20 ENCOUNTER — Ambulatory Visit: Payer: Self-pay | Admitting: Neurology

## 2022-08-20 DIAGNOSIS — M79604 Pain in right leg: Secondary | ICD-10-CM

## 2022-08-20 DIAGNOSIS — G5603 Carpal tunnel syndrome, bilateral upper limbs: Secondary | ICD-10-CM | POA: Diagnosis not present

## 2022-08-20 DIAGNOSIS — Z0289 Encounter for other administrative examinations: Secondary | ICD-10-CM

## 2022-08-20 DIAGNOSIS — M5417 Radiculopathy, lumbosacral region: Secondary | ICD-10-CM

## 2022-08-20 NOTE — Progress Notes (Signed)
Full Name: Wendy [name] Gender: Female MRN #: [id number] Date of Birth: 11/14/1940    Visit Date: 08/20/2022 07:20 Age: 82 Years Examining Physician: Dr. Naomie Dean Referring Physician: Dr. Naomie Dean Height: 4 feet 11 inch    History: Numbness right foot, weakness right foot, pain in the low back, shooting to the foot, pain and numbness right thigh. Pain mostly lower back and back of the thigh  on the right only. Sometimes radiates all the way to the calf. Right only, left leg asymptomatic. MRI completed in 2017 showed possible multi-level nerve impingement and central spinal stenosis.  But  neither the new MRI of the lumbar spine or the CT Myelogram showed any significant pathology as compared to the MRI she had in 2017:  -MRI 2017: There is mild anterior listhesis of L4 and L5, grade 1 which is unchanged.  Disc desiccation and degenerative disc disease is seen in essentially all of the lumbar spines.  There is slight bulging of the disc at L2-L3 but without focal disc herniation or spinal stenosis.At L3-L4 there is bulging of the disc with bilateral facet hypertrophy.  The AP diameter of the sac measures 8 mm and previously measured 10 mm indicating increasing spinal stenosis.At L4-L5 there is bulging of the disc and there is disc material in the right lateral recess and neural foramen which may be impinging on the right L4 nerve root.  This appears relatively similar compared to the prior study.  There is also moderate spinal stenosis at this level which may be slightly worse compared to the prior study.L5-S1 level is grossly unremarkable.  The cauda equina are not thickened.  There are multiple fibroids within the uterus. -MRI 2024: MRI lumbar spine (without) demonstrating: At L4-5: pseudo-disc bulging and facet hypertrophy with mild spinal stenosis and mild biforaminal stenosis. Mild additional degenerative changes and disc bulging from L1 to down to L5-S1 as above - CT Myelogram 2024:  IMPRESSION:1. Mild left foraminal disc bulge L1-2 without spinal or foraminal stenosis.2. Mild disc bulge L2-3 without spinal or foraminal stenosis.3. Mild disc bulge L3-4 with mild spinal stenosis, left foraminal encroachment.4. Advanced facet DJD L4-5 with grade 1 anterolisthesis, right greater than left foraminal encroachment.  During the exam, she also mentioned numb fingers sounding like CTS and we performed limited NCS to the uppers which showed bilateral CTS  Summary:   NCS: performed on all 4 limbs.   All lower extremity nerves (as indicated in the following tables) were within normal limits.  The right median APB motor nerve was within normal limits The left  median APB motor nerve was within normal limits The left Median 2nd Digit orthodromic sensory nerve showed prolonged distal peak latency (4.3 ms, N<3.4) and reduced amplitude(8 uV, N>10) The right  Median 2nd Digit orthodromic sensory nerve showed prolonged distal peak latency (4 ms, N<3.4)   The right median/ulnar (palm) comparison nerve showed prolonged distal peak latency (Median Palm, 2.8 ms, N<2.2) and abnormal peak latency difference (Median Palm-Ulnar Palm, 0.9 ms, N<0.4) with a relative median delay.    The left median/ulnar (palm) comparison nerve showed prolonged distal peak latency (Median Palm, 2.9 ms, N<2.2) and abnormal peak latency difference (Median Palm-Ulnar Palm, 0.9 ms, N<0.4) with a relative median delay.    EMG: The right medial gastrocnemius muscle showed diminished motor unit recruitment. The extensor hallucis muscle showed diminissed motor unit recruitment and polyphasic motor units.  All remaining muscles (as indicated in the following tables)  were within normal limits.      Conclusion:  Nerve conductions on the lower extremities were all within normal limits. EMG showed chronic neurogenic changes in distal right-sided muscles that share S1>L5 nerve roots but no acute/ongoing denervation suggesting  possible remote/chronic right lumbosacral radiculopathy. No suggestion of polyneuropathy or neurogenic muscle disease. Continue physical therapy and seeing Dr. Lorrine Kin for evaluation of low back injections. There is concomitant mild to moderate bilateral Carpal Tunnel Syndrome. She declines any intervention, would like to splint for 4-6 weeks and will get back to [place] if needed.      ------------------------------- Naomie Dean, M.D.  [facility] [address] [place], [place] [address] Tel: [phone] Fax: [phone]  Verbal informed consent was obtained from the patient, patient was informed of potential risk of procedure, including bruising, bleeding, hematoma formation, infection, muscle weakness, muscle pain, numbness, among others.        MNC    Nerve / Sites Muscle Latency Ref. Amplitude Ref. Rel Amp Segments Distance Velocity Ref. Area    ms ms mV mV %  cm m/s m/s mVms  R Median - APB     Wrist APB 4.2 ?4.4 4.1 ?4.0 100 Wrist - APB 7   18.7     Upper arm APB 9.5  4.0  97.8 Upper arm - Wrist 26 50 ?49 18.7  L Median - APB     Wrist APB 4.0 ?4.4 4.9 ?4.0 100 Wrist - APB 7   23.2     Upper arm APB 8.6  5.2  105 Upper arm - Wrist 23 50 ?49 23.4  R Peroneal - EDB     Ankle EDB 4.2 ?6.5 3.9 ?2.0 100 Ankle - EDB 9   12.2     Fib head EDB 8.5  4.1  105 Fib head - Ankle 22 51 ?44 13.9     Pop fossa EDB 10.2  4.0  98.1 Pop fossa - Fib head 10 59 ?44 15.2         Pop fossa - Ankle      L Peroneal - EDB     Ankle EDB 4.7 ?6.5 2.8 ?2.0 100 Ankle - EDB 9   10.7     Fib head EDB 9.1  3.2  116 Fib head - Ankle 23 52 ?44 12.5     Pop fossa EDB 10.7  3.2  97.8 Pop fossa - Fib head 9 58 ?44 12.3         Pop fossa - Ankle      R Tibial - AH     Ankle AH 5.7 ?5.8 10.4 ?4.0 100 Ankle - AH 9   19.9     Pop fossa AH 11.0  6.6  63.5 Pop fossa - Ankle 32 60 ?41 19.8  L Tibial - AH     Ankle AH 4.7 ?5.8 5.0 ?4.0 100 Ankle - AH 9   15.2     Pop fossa AH 11.4  4.5   88.5 Pop fossa - Ankle 33 49 ?41 16.6                 SNC    Nerve / Sites Rec. Site Peak Lat Ref.  Amp Ref. Segments Distance Peak Diff Ref.    ms ms V V  cm ms ms  R Sural - Ankle (Calf)     Calf Ankle 3.4 ?4.4 9 ?6 Calf - Ankle 14    L Sural - Ankle (Calf)  Calf Ankle 3.8 ?4.4 12 ?6 Calf - Ankle 14    R Superficial peroneal - Ankle     Lat leg Ankle 3.4 ?4.4 7 ?6 Lat leg - Ankle 14    L Superficial peroneal - Ankle     Lat leg Ankle 3.7 ?4.4 8 ?6 Lat leg - Ankle 14    R Median, Ulnar - Transcarpal comparison     Median Palm Wrist 2.8 ?2.2 58 ?35 Median Palm - Wrist 8       Ulnar Palm Wrist 1.8 ?2.2 19 ?12 Ulnar Palm - Wrist 8          Median Palm - Ulnar Palm  0.9 ?0.4  L Median, Ulnar - Transcarpal comparison     Median Palm Wrist 2.9 ?2.2 28 ?35 Median Palm - Wrist 8       Ulnar Palm Wrist 2.0 ?2.2 12 ?12 Ulnar Palm - Wrist 8          Median Palm - Ulnar Palm  0.9 ?0.4  L Median - Orthodromic (Dig II, Mid palm)     Dig II Wrist 4.3 ?3.4 8 ?10 Dig II - Wrist 13    R Median - Orthodromic (Dig II, Mid palm)     Dig II Wrist 4.0 ?3.4 13 ?10 Dig II - Wrist 52                       F  Wave    Nerve F Lat Ref.   ms ms  R Tibial - AH 43.3 ?56.0  L Tibial - AH 45.6 ?56.0         H Reflex    Nerve H Lat Lat Hmax   ms ms   Left Right Ref. Left Right Ref.  Tibial - [place] 33.5 32.6 ?35.0 21.9 21.3 ?35.0         EMG Summary Table    Spontaneous MUAP Recruitment  Muscle IA Fib PSW Fasc Other Amp Dur. Poly Pattern  R. Lumbar paraspinals (low) Normal None None None _______ Normal Normal Normal Normal  R. Tibialis anterior Normal None None None _______ Normal Normal Normal Normal  R. Gastrocnemius (Medial head) Normal None None None _______ Normal Normal Normal Reduced  R. Extensor hallucis longus Normal None None None _______ Normal Normal 2+ Reduced  R. Adductor longus Normal None None None _______ Normal Normal Normal Normal  R. Biceps femoris (long head) Normal None None  None _______ Normal Normal Normal Normal  R. Gluteus maximus Normal None None None _______ Normal Normal Normal Normal  R. Gluteus medius Normal None None None _______ Normal Normal Normal reduced

## 2022-08-20 NOTE — Procedures (Signed)
      Full Name: Wendy [name] Gender: Female MRN #: [id number] Date of Birth: 07/24/1940    Visit Date: 08/20/2022 07:20 Age: 82 Years Examining Physician: Dr. Charne Mcbrien Referring Physician: Dr. Annalynn Centanni Height: 4 feet 11 inch    History: Numbness right foot, weakness right foot, pain in the low back, shooting to the foot, pain and numbness right thigh. Pain mostly lower back and back of the thigh  on the right only. Sometimes radiates all the way to the calf. Right only, left leg asymptomatic. MRI completed in 2017 showed possible multi-level nerve impingement and central spinal stenosis.  But  neither the new MRI of the lumbar spine or the CT Myelogram showed any significant pathology as compared to the MRI she had in 2017:  -MRI 2017: There is mild anterior listhesis of L4 and L5, grade 1 which is unchanged.  Disc desiccation and degenerative disc disease is seen in essentially all of the lumbar spines.  There is slight bulging of the disc at L2-L3 but without focal disc herniation or spinal stenosis.At L3-L4 there is bulging of the disc with bilateral facet hypertrophy.  The AP diameter of the sac measures 8 mm and previously measured 10 mm indicating increasing spinal stenosis.At L4-L5 there is bulging of the disc and there is disc material in the right lateral recess and neural foramen which may be impinging on the right L4 nerve root.  This appears relatively similar compared to the prior study.  There is also moderate spinal stenosis at this level which may be slightly worse compared to the prior study.L5-S1 level is grossly unremarkable.  The cauda equina are not thickened.  There are multiple fibroids within the uterus. -MRI 2024: MRI lumbar spine (without) demonstrating: At L4-5: pseudo-disc bulging and facet hypertrophy with mild spinal stenosis and mild biforaminal stenosis. Mild additional degenerative changes and disc bulging from L1 to down to L5-S1 as above - CT Myelogram 2024:  IMPRESSION:1. Mild left foraminal disc bulge L1-2 without spinal or foraminal stenosis.2. Mild disc bulge L2-3 without spinal or foraminal stenosis.3. Mild disc bulge L3-4 with mild spinal stenosis, left foraminal encroachment.4. Advanced facet DJD L4-5 with grade 1 anterolisthesis, right greater than left foraminal encroachment.  During the exam, she also mentioned numb fingers sounding like CTS and we performed limited NCS to the uppers which showed bilateral CTS  Summary:   NCS: performed on all 4 limbs.   All lower extremity nerves (as indicated in the following tables) were within normal limits.  The right median APB motor nerve was within normal limits The left  median APB motor nerve was within normal limits The left Median 2nd Digit orthodromic sensory nerve showed prolonged distal peak latency (4.3 ms, N<3.4) and reduced amplitude(8 uV, N>10) The right  Median 2nd Digit orthodromic sensory nerve showed prolonged distal peak latency (4 ms, N<3.4)   The right median/ulnar (palm) comparison nerve showed prolonged distal peak latency (Median Palm, 2.8 ms, N<2.2) and abnormal peak latency difference (Median Palm-Ulnar Palm, 0.9 ms, N<0.4) with a relative median delay.    The left median/ulnar (palm) comparison nerve showed prolonged distal peak latency (Median Palm, 2.9 ms, N<2.2) and abnormal peak latency difference (Median Palm-Ulnar Palm, 0.9 ms, N<0.4) with a relative median delay.    EMG: The right medial gastrocnemius muscle showed diminished motor unit recruitment. The extensor hallucis muscle showed diminissed motor unit recruitment and polyphasic motor units.  All remaining muscles (as indicated in the following tables)   were within normal limits.      Conclusion:  Nerve conductions on the lower extremities were all within normal limits. EMG showed chronic neurogenic changes in distal right-sided muscles that share S1>L5 nerve roots but no acute/ongoing denervation suggesting  possible remote/chronic right lumbosacral radiculopathy. No suggestion of polyneuropathy or neurogenic muscle disease. Continue physical therapy and seeing Dr. Eichman for evaluation of low back injections. There is concomitant mild to moderate bilateral Carpal Tunnel Syndrome. She declines any intervention, would like to splint for 4-6 weeks and will get back to us if needed.      ------------------------------- Anelis Hrivnak, M.D.  [facility] [address], [address] [place], [place] [address] Tel: [phone] Fax: [phone]  Verbal informed consent was obtained from the patient, patient was informed of potential risk of procedure, including bruising, bleeding, hematoma formation, infection, muscle weakness, muscle pain, numbness, among others.        MNC    Nerve / Sites Muscle Latency Ref. Amplitude Ref. Rel Amp Segments Distance Velocity Ref. Area    ms ms mV mV %  cm m/s m/s mVms  R Median - APB     Wrist APB 4.2 ?4.4 4.1 ?4.0 100 Wrist - APB 7   18.7     Upper arm APB 9.5  4.0  97.8 Upper arm - Wrist 26 50 ?49 18.7  L Median - APB     Wrist APB 4.0 ?4.4 4.9 ?4.0 100 Wrist - APB 7   23.2     Upper arm APB 8.6  5.2  105 Upper arm - Wrist 23 50 ?49 23.4  R Peroneal - EDB     Ankle EDB 4.2 ?6.5 3.9 ?2.0 100 Ankle - EDB 9   12.2     Fib head EDB 8.5  4.1  105 Fib head - Ankle 22 51 ?44 13.9     Pop fossa EDB 10.2  4.0  98.1 Pop fossa - Fib head 10 59 ?44 15.2         Pop fossa - Ankle      L Peroneal - EDB     Ankle EDB 4.7 ?6.5 2.8 ?2.0 100 Ankle - EDB 9   10.7     Fib head EDB 9.1  3.2  116 Fib head - Ankle 23 52 ?44 12.5     Pop fossa EDB 10.7  3.2  97.8 Pop fossa - Fib head 9 58 ?44 12.3         Pop fossa - Ankle      R Tibial - AH     Ankle AH 5.7 ?5.8 10.4 ?4.0 100 Ankle - AH 9   19.9     Pop fossa AH 11.0  6.6  63.5 Pop fossa - Ankle 32 60 ?41 19.8  L Tibial - AH     Ankle AH 4.7 ?5.8 5.0 ?4.0 100 Ankle - AH 9   15.2     Pop fossa AH 11.4  4.5   88.5 Pop fossa - Ankle 33 49 ?41 16.6                 SNC    Nerve / Sites Rec. Site Peak Lat Ref.  Amp Ref. Segments Distance Peak Diff Ref.    ms ms V V  cm ms ms  R Sural - Ankle (Calf)     Calf Ankle 3.4 ?4.4 9 ?6 Calf - Ankle 14    L Sural - Ankle (Calf)       Calf Ankle 3.8 ?4.4 12 ?6 Calf - Ankle 14    R Superficial peroneal - Ankle     Lat leg Ankle 3.4 ?4.4 7 ?6 Lat leg - Ankle 14    L Superficial peroneal - Ankle     Lat leg Ankle 3.7 ?4.4 8 ?6 Lat leg - Ankle 14    R Median, Ulnar - Transcarpal comparison     Median Palm Wrist 2.8 ?2.2 58 ?35 Median Palm - Wrist 8       Ulnar Palm Wrist 1.8 ?2.2 19 ?12 Ulnar Palm - Wrist 8          Median Palm - Ulnar Palm  0.9 ?0.4  L Median, Ulnar - Transcarpal comparison     Median Palm Wrist 2.9 ?2.2 28 ?35 Median Palm - Wrist 8       Ulnar Palm Wrist 2.0 ?2.2 12 ?12 Ulnar Palm - Wrist 8          Median Palm - Ulnar Palm  0.9 ?0.4  L Median - Orthodromic (Dig II, Mid palm)     Dig II Wrist 4.3 ?3.4 8 ?10 Dig II - Wrist 13    R Median - Orthodromic (Dig II, Mid palm)     Dig II Wrist 4.0 ?3.4 13 ?10 Dig II - Wrist 13                       F  Wave    Nerve F Lat Ref.   ms ms  R Tibial - AH 43.3 ?56.0  L Tibial - AH 45.6 ?56.0         H Reflex    Nerve H Lat Lat Hmax   ms ms   Left Right Ref. Left Right Ref.  Tibial - [place] 33.5 32.6 ?35.0 21.9 21.3 ?35.0         EMG Summary Table    Spontaneous MUAP Recruitment  Muscle IA Fib PSW Fasc Other Amp Dur. Poly Pattern  R. Lumbar paraspinals (low) Normal None None None _______ Normal Normal Normal Normal  R. Tibialis anterior Normal None None None _______ Normal Normal Normal Normal  R. Gastrocnemius (Medial head) Normal None None None _______ Normal Normal Normal Reduced  R. Extensor hallucis longus Normal None None None _______ Normal Normal 2+ Reduced  R. Adductor longus Normal None None None _______ Normal Normal Normal Normal  R. Biceps femoris (long head) Normal None None  None _______ Normal Normal Normal Normal  R. Gluteus maximus Normal None None None _______ Normal Normal Normal Normal  R. Gluteus medius Normal None None None _______ Normal Normal Normal reduced     

## 2022-08-20 NOTE — Patient Instructions (Addendum)
Carpal tunnel syndrome both hands and right chronic lumbosacral radiculopathy [place]:   Carpal Tunnel Syndrome  Carpal tunnel syndrome is a condition that causes pain, numbness, and weakness in your hand and fingers. The carpal tunnel is a narrow area located on the palm side of your wrist. Repeated wrist motion or certain diseases may cause swelling within the tunnel. This swelling pinches the main nerve in the wrist. The main nerve in the wrist is called the median nerve. What are the causes? This condition may be caused by: Repeated and forceful wrist and hand motions. Wrist injuries. Arthritis. A cyst or tumor in the carpal tunnel. Fluid buildup during pregnancy. Use of tools that vibrate. Sometimes the cause of this condition is not known. What increases the risk? The following factors may make you more likely to develop this condition: Having a job that requires you to repeatedly or forcefully move your wrist or hand or requires you to use tools that vibrate. This may include jobs that involve using computers, working on an [employer], or working with power tools such as Radiographer, therapeutic. Being a woman. Having certain conditions, such as: Diabetes. Obesity. An underactive thyroid (hypothyroidism). Kidney failure. Rheumatoid arthritis. What are the signs or symptoms? Symptoms of this condition include: A tingling feeling in your fingers, especially in your thumb, index, and middle fingers. Tingling or numbness in your hand. An aching feeling in your entire arm, especially when your wrist and elbow are bent for a long time. Wrist pain that goes up your arm to your shoulder. Pain that goes down into your palm or fingers. A weak feeling in your hands. You may have trouble grabbing and holding items. Your symptoms may feel worse during the night. How is this diagnosed? This condition is diagnosed with a medical history and physical exam. You may also have tests,  including: Electromyogram (EMG). This test measures electrical signals sent by your nerves into the muscles. Nerve conduction study. This test measures how well electrical signals pass through your nerves. Imaging tests, such as X-rays, ultrasound, and MRI. These tests check for possible causes of your condition. How is this treated? This condition may be treated with: Lifestyle changes. It is important to stop or change the activity that caused your condition. Doing exercise and activities to strengthen and stretch your muscles and tendons (physical therapy). Making lifestyle changes to help with your condition and learning how to do your daily activities safely (occupational therapy). Medicines for pain and inflammation. This may include medicine that is injected into your wrist. A wrist splint or brace. Surgery. Follow these instructions at home: If you have a splint or brace: Wear the splint or brace as told by your health care provider. Remove it only as told by your health care provider. Loosen the splint or brace if your fingers tingle, become numb, or turn cold and blue. Keep the splint or brace clean. If the splint or brace is not waterproof: Do not let it get wet. Cover it with a watertight covering when you take a bath or shower. Managing pain, stiffness, and swelling If directed, put ice on the painful area. To do this: If you have a removeable splint or brace, remove it as told by your health care provider. Put ice in a plastic bag. Place a towel between your skin and the bag or between the splint or brace and the bag. Leave the ice on for 20 minutes, 2-3 times a day. Do not fall asleep with  the cold pack on your skin. Remove the ice if your skin turns bright red. This is very important. If you cannot feel pain, heat, or cold, you have a greater risk of damage to the area. Move your fingers often to reduce stiffness and swelling. General instructions Take over-the-counter  and prescription medicines only as told by your health care provider. Rest your wrist and hand from any activity that may be causing your pain. If your condition is work related, talk with your employer about changes that can be made, such as getting a wrist pad to use while typing. Do any exercises as told by your health care provider, physical therapist, or occupational therapist. Keep all follow-up visits. This is important. Contact a health care provider if: You have new symptoms. Your pain is not controlled with medicines. Your symptoms get worse. Get help right away if: You have severe numbness or tingling in your wrist or hand. Summary Carpal tunnel syndrome is a condition that causes pain, numbness, and weakness in your hand and fingers. It is usually caused by repeated wrist motions. Lifestyle changes and medicines are used to treat carpal tunnel syndrome. Surgery may be recommended. Follow your health care provider's instructions about wearing a splint, resting from activity, keeping follow-up visits, and calling for help. This information is not intended to replace advice given to you by your health care provider. Make sure you discuss any questions you have with your health care provider.  Lumbosacral Radiculopathy Lumbosacral radiculopathy is a condition that involves the spinal nerves and nerve roots in the low back and bottom of the spine. The condition develops when these nerves and nerve roots move out of place or become inflamed and cause symptoms. What are the causes? This condition may be caused by: Pressure from a disk that bulges out of place (herniated disk). A disk is a plate of soft cartilage that separates bones in the spine. Disk changes that occur with age (disk degeneration). A narrowing of the bones of the lower back (spinal stenosis). A tumor. An infection. An injury that places sudden pressure on the disks that cushion the bones of your lower spine. What  increases the risk? You are more likely to develop this condition if: You are a female who is 1-17 years old. You are a female who is 1-36 years old. You use improper technique when lifting things. You are overweight or live a sedentary lifestyle. You smoke. Your work requires frequent lifting. You do repetitive activities that strain the spine. What are the signs or symptoms? Symptoms of this condition include: Pain that goes down from your back into your legs (sciatica), usually on one side of the body. This is the most common symptom. The pain may be worse when you sit, cough, or sneeze. Tingling and numbness in your legs. Muscle weakness in your legs. Loss of bladder control or bowel control. How is this diagnosed? This condition may be diagnosed based on: Your symptoms and medical history. A physical exam. If the pain lasts, you may have tests, such as: MRI. X-ray. CT scan. A type of CT scan used to examine the spinal canal after injecting a dye into your spine (myelogram). A test to measure how electrical impulses move through a nerve (nerve conduction study). A test to measure the electrical activity in muscles (electromyogram). How is this treated? In many cases, treatment is not needed for this condition. With rest, the condition usually gets better over time. If treatment is needed, it  may include: Working with a physical therapist to improve strength and flexibility. Taking pain medicine. Applying heat or ice or both to the affected areas. Having chiropractic spinal manipulation. Using transcutaneous electrical nerve stimulation (TENS) therapy. Getting a steroid injection in the spine. Having surgery. This may be needed if other treatments do not help. Different types of surgery may be done depending on the cause of this condition. Follow these instructions at home: Activity Avoid bending and any other activities that make the problem worse. Maintain a proper position  when standing or sitting. When standing, keep your upper back and neck straight with your shoulders pulled back. Avoid slouching. When sitting, keep your back straight and relax your shoulders. Do not round your shoulders or pull them backward. Do not sit or stand in one place for long periods of time. Take brief periods of rest throughout the day. This will reduce your pain. It is usually better to rest by lying down or standing, not sitting. Mix in mild activity or stretching between long periods of rest. This will help to prevent stiffness and pain. Get regular exercise. Ask your health care provider what activities are safe for you. If you were shown how to do any exercises or stretches, do them as told by your health care provider. You may have to avoid lifting. Ask your health care provider how much you can safely lift. Always use proper lifting technique, which includes: Bending your knees. Keeping the load close to your body. Avoiding twisting. Managing pain If directed, put ice on the affected area. To do this: Put ice in a plastic bag. Place a towel between your skin and the bag. Leave the ice on for 20 minutes, 2-3 times a day. Remove the ice if your skin turns bright red. This is very important. If you cannot feel pain, heat, or cold, you have a greater risk of damage to the area. If directed, apply heat to the affected area as often as told by your health care provider. Use the heat source that your health care provider recommends, such as a moist heat pack or a heating pad. Place a towel between your skin and the heat source. Leave the heat on for 20-30 minutes. Remove the heat if your skin turns bright red. This is especially important if you are unable to feel pain, heat, or cold. You have a greater risk of getting burned. Take over-the-counter and prescription medicines only as told by your health care provider. General instructions Sleep on a firm mattress in a comfortable  position. Try lying on your side with your knees slightly bent. If you lie on your back, put a pillow under your knees. Ask your health care provider if the medicine prescribed to you requires you to avoid driving or using machinery. If your health care provider prescribed a diet or exercise program, follow it as told. Keep all follow-up visits. This is important. Contact a health care provider if: Your pain does not get better over time, even when taking pain medicines. Get help right away if: You develop severe pain. Your pain suddenly gets worse. You develop increasing weakness in your legs. You lose the ability to control your bladder or bowel. You have difficulty walking or balancing. You have a fever. Summary Lumbosacral radiculopathy is a condition that occurs when the spinal nerves and nerve roots in the lower part of the spine move out of place or become inflamed and cause symptoms. Symptoms include pain, numbness, and tingling that  go down from your back into your legs (sciatica), muscle weakness, and loss of bladder control or bowel control. If directed, apply ice or heat or both to the affected area as told by your health care provider. Follow instructions about activity, rest, and proper lifting technique. This information is not intended to replace advice given to you by your health care provider. Make sure you discuss any questions you have with your health care provider. Document Revised: 08/15/2020 Document Reviewed: 08/15/2020 Elsevier Patient Education  2024 Elsevier Inc.  Document Revised: 06/22/2019 Document Reviewed: 06/22/2019 Elsevier Patient Education  2024 [employer].

## 2022-08-21 DIAGNOSIS — M2569 Stiffness of other specified joint, not elsewhere classified: Secondary | ICD-10-CM | POA: Diagnosis not present

## 2022-08-21 DIAGNOSIS — R262 Difficulty in walking, not elsewhere classified: Secondary | ICD-10-CM | POA: Diagnosis not present

## 2022-08-21 DIAGNOSIS — M62551 Muscle wasting and atrophy, not elsewhere classified, right thigh: Secondary | ICD-10-CM | POA: Diagnosis not present

## 2022-08-21 DIAGNOSIS — M5416 Radiculopathy, lumbar region: Secondary | ICD-10-CM | POA: Diagnosis not present

## 2022-08-26 DIAGNOSIS — L989 Disorder of the skin and subcutaneous tissue, unspecified: Secondary | ICD-10-CM | POA: Diagnosis not present

## 2022-09-07 DIAGNOSIS — M7050 Other bursitis of knee, unspecified knee: Secondary | ICD-10-CM | POA: Diagnosis not present

## 2022-09-07 DIAGNOSIS — M25561 Pain in right knee: Secondary | ICD-10-CM | POA: Diagnosis not present

## 2022-09-08 DIAGNOSIS — R262 Difficulty in walking, not elsewhere classified: Secondary | ICD-10-CM | POA: Diagnosis not present

## 2022-09-08 DIAGNOSIS — M5416 Radiculopathy, lumbar region: Secondary | ICD-10-CM | POA: Diagnosis not present

## 2022-09-08 DIAGNOSIS — M2569 Stiffness of other specified joint, not elsewhere classified: Secondary | ICD-10-CM | POA: Diagnosis not present

## 2022-09-08 DIAGNOSIS — M62551 Muscle wasting and atrophy, not elsewhere classified, right thigh: Secondary | ICD-10-CM | POA: Diagnosis not present

## 2022-09-10 DIAGNOSIS — M62551 Muscle wasting and atrophy, not elsewhere classified, right thigh: Secondary | ICD-10-CM | POA: Diagnosis not present

## 2022-09-10 DIAGNOSIS — R262 Difficulty in walking, not elsewhere classified: Secondary | ICD-10-CM | POA: Diagnosis not present

## 2022-09-10 DIAGNOSIS — M5416 Radiculopathy, lumbar region: Secondary | ICD-10-CM | POA: Diagnosis not present

## 2022-09-10 DIAGNOSIS — M2569 Stiffness of other specified joint, not elsewhere classified: Secondary | ICD-10-CM | POA: Diagnosis not present

## 2022-09-17 DIAGNOSIS — M2569 Stiffness of other specified joint, not elsewhere classified: Secondary | ICD-10-CM | POA: Diagnosis not present

## 2022-09-17 DIAGNOSIS — R262 Difficulty in walking, not elsewhere classified: Secondary | ICD-10-CM | POA: Diagnosis not present

## 2022-09-17 DIAGNOSIS — M5416 Radiculopathy, lumbar region: Secondary | ICD-10-CM | POA: Diagnosis not present

## 2022-09-17 DIAGNOSIS — M62551 Muscle wasting and atrophy, not elsewhere classified, right thigh: Secondary | ICD-10-CM | POA: Diagnosis not present

## 2022-09-22 DIAGNOSIS — M5416 Radiculopathy, lumbar region: Secondary | ICD-10-CM | POA: Diagnosis not present

## 2022-09-22 DIAGNOSIS — M62551 Muscle wasting and atrophy, not elsewhere classified, right thigh: Secondary | ICD-10-CM | POA: Diagnosis not present

## 2022-09-22 DIAGNOSIS — M2569 Stiffness of other specified joint, not elsewhere classified: Secondary | ICD-10-CM | POA: Diagnosis not present

## 2022-09-22 DIAGNOSIS — R262 Difficulty in walking, not elsewhere classified: Secondary | ICD-10-CM | POA: Diagnosis not present

## 2022-09-24 DIAGNOSIS — R262 Difficulty in walking, not elsewhere classified: Secondary | ICD-10-CM | POA: Diagnosis not present

## 2022-09-24 DIAGNOSIS — M2569 Stiffness of other specified joint, not elsewhere classified: Secondary | ICD-10-CM | POA: Diagnosis not present

## 2022-09-24 DIAGNOSIS — M5416 Radiculopathy, lumbar region: Secondary | ICD-10-CM | POA: Diagnosis not present

## 2022-09-24 DIAGNOSIS — M62551 Muscle wasting and atrophy, not elsewhere classified, right thigh: Secondary | ICD-10-CM | POA: Diagnosis not present

## 2022-09-24 DIAGNOSIS — M25561 Pain in right knee: Secondary | ICD-10-CM | POA: Diagnosis not present

## 2022-09-29 DIAGNOSIS — M5416 Radiculopathy, lumbar region: Secondary | ICD-10-CM | POA: Diagnosis not present

## 2022-09-29 DIAGNOSIS — M62551 Muscle wasting and atrophy, not elsewhere classified, right thigh: Secondary | ICD-10-CM | POA: Diagnosis not present

## 2022-09-29 DIAGNOSIS — R262 Difficulty in walking, not elsewhere classified: Secondary | ICD-10-CM | POA: Diagnosis not present

## 2022-09-29 DIAGNOSIS — M2569 Stiffness of other specified joint, not elsewhere classified: Secondary | ICD-10-CM | POA: Diagnosis not present

## 2022-10-01 DIAGNOSIS — R262 Difficulty in walking, not elsewhere classified: Secondary | ICD-10-CM | POA: Diagnosis not present

## 2022-10-01 DIAGNOSIS — M5416 Radiculopathy, lumbar region: Secondary | ICD-10-CM | POA: Diagnosis not present

## 2022-10-01 DIAGNOSIS — M2569 Stiffness of other specified joint, not elsewhere classified: Secondary | ICD-10-CM | POA: Diagnosis not present

## 2022-10-01 DIAGNOSIS — M62551 Muscle wasting and atrophy, not elsewhere classified, right thigh: Secondary | ICD-10-CM | POA: Diagnosis not present

## 2022-10-06 DIAGNOSIS — M62551 Muscle wasting and atrophy, not elsewhere classified, right thigh: Secondary | ICD-10-CM | POA: Diagnosis not present

## 2022-10-06 DIAGNOSIS — R262 Difficulty in walking, not elsewhere classified: Secondary | ICD-10-CM | POA: Diagnosis not present

## 2022-10-06 DIAGNOSIS — M2569 Stiffness of other specified joint, not elsewhere classified: Secondary | ICD-10-CM | POA: Diagnosis not present

## 2022-10-06 DIAGNOSIS — M5416 Radiculopathy, lumbar region: Secondary | ICD-10-CM | POA: Diagnosis not present

## 2022-10-08 DIAGNOSIS — M5416 Radiculopathy, lumbar region: Secondary | ICD-10-CM | POA: Diagnosis not present

## 2022-10-08 DIAGNOSIS — R262 Difficulty in walking, not elsewhere classified: Secondary | ICD-10-CM | POA: Diagnosis not present

## 2022-10-08 DIAGNOSIS — M25561 Pain in right knee: Secondary | ICD-10-CM | POA: Diagnosis not present

## 2022-10-08 DIAGNOSIS — M62551 Muscle wasting and atrophy, not elsewhere classified, right thigh: Secondary | ICD-10-CM | POA: Diagnosis not present

## 2022-10-08 DIAGNOSIS — M2569 Stiffness of other specified joint, not elsewhere classified: Secondary | ICD-10-CM | POA: Diagnosis not present

## 2022-10-13 DIAGNOSIS — R262 Difficulty in walking, not elsewhere classified: Secondary | ICD-10-CM | POA: Diagnosis not present

## 2022-10-13 DIAGNOSIS — M62551 Muscle wasting and atrophy, not elsewhere classified, right thigh: Secondary | ICD-10-CM | POA: Diagnosis not present

## 2022-10-13 DIAGNOSIS — M5416 Radiculopathy, lumbar region: Secondary | ICD-10-CM | POA: Diagnosis not present

## 2022-10-13 DIAGNOSIS — M2569 Stiffness of other specified joint, not elsewhere classified: Secondary | ICD-10-CM | POA: Diagnosis not present

## 2022-10-14 DIAGNOSIS — E559 Vitamin D deficiency, unspecified: Secondary | ICD-10-CM | POA: Diagnosis not present

## 2022-10-14 DIAGNOSIS — I10 Essential (primary) hypertension: Secondary | ICD-10-CM | POA: Diagnosis not present

## 2022-10-15 ENCOUNTER — Ambulatory Visit: Payer: Self-pay | Admitting: General Surgery

## 2022-10-15 ENCOUNTER — Encounter (HOSPITAL_BASED_OUTPATIENT_CLINIC_OR_DEPARTMENT_OTHER): Payer: Self-pay | Admitting: General Surgery

## 2022-10-15 DIAGNOSIS — M2569 Stiffness of other specified joint, not elsewhere classified: Secondary | ICD-10-CM | POA: Diagnosis not present

## 2022-10-15 DIAGNOSIS — D709 Neutropenia, unspecified: Secondary | ICD-10-CM | POA: Diagnosis not present

## 2022-10-15 DIAGNOSIS — M5416 Radiculopathy, lumbar region: Secondary | ICD-10-CM | POA: Diagnosis not present

## 2022-10-15 DIAGNOSIS — L989 Disorder of the skin and subcutaneous tissue, unspecified: Secondary | ICD-10-CM | POA: Diagnosis not present

## 2022-10-15 DIAGNOSIS — I10 Essential (primary) hypertension: Secondary | ICD-10-CM | POA: Diagnosis not present

## 2022-10-15 DIAGNOSIS — R262 Difficulty in walking, not elsewhere classified: Secondary | ICD-10-CM | POA: Diagnosis not present

## 2022-10-15 DIAGNOSIS — M62551 Muscle wasting and atrophy, not elsewhere classified, right thigh: Secondary | ICD-10-CM | POA: Diagnosis not present

## 2022-10-15 DIAGNOSIS — Z789 Other specified health status: Secondary | ICD-10-CM | POA: Diagnosis not present

## 2022-10-19 DIAGNOSIS — M2569 Stiffness of other specified joint, not elsewhere classified: Secondary | ICD-10-CM | POA: Diagnosis not present

## 2022-10-19 DIAGNOSIS — R262 Difficulty in walking, not elsewhere classified: Secondary | ICD-10-CM | POA: Diagnosis not present

## 2022-10-19 DIAGNOSIS — M5416 Radiculopathy, lumbar region: Secondary | ICD-10-CM | POA: Diagnosis not present

## 2022-10-19 DIAGNOSIS — M62551 Muscle wasting and atrophy, not elsewhere classified, right thigh: Secondary | ICD-10-CM | POA: Diagnosis not present

## 2022-10-20 ENCOUNTER — Ambulatory Visit: Payer: Medicare Other | Admitting: Cardiology

## 2022-10-20 ENCOUNTER — Encounter (HOSPITAL_BASED_OUTPATIENT_CLINIC_OR_DEPARTMENT_OTHER): Payer: Self-pay | Admitting: Anesthesiology

## 2022-10-20 DIAGNOSIS — M25561 Pain in right knee: Secondary | ICD-10-CM | POA: Diagnosis not present

## 2022-10-21 ENCOUNTER — Encounter (HOSPITAL_BASED_OUTPATIENT_CLINIC_OR_DEPARTMENT_OTHER): Payer: Self-pay | Admitting: General Surgery

## 2022-10-21 ENCOUNTER — Ambulatory Visit (HOSPITAL_BASED_OUTPATIENT_CLINIC_OR_DEPARTMENT_OTHER)
Admission: RE | Admit: 2022-10-21 | Discharge: 2022-10-21 | Disposition: A | Discharge status: Home / Self Care | Payer: Medicare Other | Attending: General Surgery | Admitting: General Surgery

## 2022-10-21 ENCOUNTER — Encounter (HOSPITAL_BASED_OUTPATIENT_CLINIC_OR_DEPARTMENT_OTHER)
Admission: RE | Disposition: A | Discharge status: Home / Self Care | Payer: Self-pay | Source: Home / Self Care | Attending: General Surgery

## 2022-10-21 DIAGNOSIS — I252 Old myocardial infarction: Secondary | ICD-10-CM | POA: Diagnosis not present

## 2022-10-21 DIAGNOSIS — L989 Disorder of the skin and subcutaneous tissue, unspecified: Secondary | ICD-10-CM | POA: Diagnosis not present

## 2022-10-21 DIAGNOSIS — L905 Scar conditions and fibrosis of skin: Secondary | ICD-10-CM | POA: Insufficient documentation

## 2022-10-21 HISTORY — PX: LESION REMOVAL: SHX5196

## 2022-10-21 SURGERY — MINOR EXCISION OF LESION
Anesthesia: LOCAL | Site: Leg Lower | Laterality: Right

## 2022-10-21 MED ORDER — LIDOCAINE 2% (20 MG/ML) 5 ML SYRINGE
INTRAMUSCULAR | Status: AC
  Filled 2022-10-21: qty 5

## 2022-10-21 MED ORDER — PROPOFOL 10 MG/ML IV BOLUS
INTRAVENOUS | Status: AC
  Filled 2022-10-21: qty 20

## 2022-10-21 MED ORDER — DEXAMETHASONE SODIUM PHOSPHATE 10 MG/ML IJ SOLN
INTRAMUSCULAR | Status: AC
  Filled 2022-10-21: qty 1

## 2022-10-21 MED ORDER — FENTANYL CITRATE (PF) 100 MCG/2ML IJ SOLN
INTRAMUSCULAR | Status: AC
  Filled 2022-10-21: qty 2

## 2022-10-21 MED ORDER — OXYCODONE HCL 5 MG PO TABS: 5.0000 mg | ORAL_TABLET | Freq: Four times a day (QID) | ORAL | 0 refills | Status: DC | PRN

## 2022-10-21 MED ORDER — LIDOCAINE-EPINEPHRINE (PF) 1 %-1:200000 IJ SOLN
INTRAMUSCULAR | Status: DC | PRN
  Administered 2022-10-21: 10 mL

## 2022-10-21 MED ORDER — ONDANSETRON HCL 4 MG/2ML IJ SOLN
INTRAMUSCULAR | Status: AC
  Filled 2022-10-21: qty 2

## 2022-10-21 SURGICAL SUPPLY — 45 items
ADH SKN CLS APL DERMABOND .7 (GAUZE/BANDAGES/DRESSINGS) ×2
APL PRP STRL LF DISP 70% ISPRP (MISCELLANEOUS) ×2
BLADE SURG 10 STRL SS (BLADE) ×1 IMPLANT
BLADE SURG 15 STRL LF DISP TIS (BLADE) ×2 IMPLANT
BLADE SURG 15 STRL SS (BLADE) ×2
CANISTER SUCT 1200ML W/VALVE (MISCELLANEOUS) IMPLANT
CHLORAPREP W/TINT 26 (MISCELLANEOUS) ×2 IMPLANT
COVER BACK TABLE 60X90IN (DRAPES) ×2 IMPLANT
COVER MAYO STAND STRL (DRAPES) ×2 IMPLANT
DERMABOND ADVANCED .7 DNX12 (GAUZE/BANDAGES/DRESSINGS) ×2 IMPLANT
DRAPE LAPAROTOMY 100X72 PEDS (DRAPES) ×2 IMPLANT
DRAPE UTILITY XL STRL (DRAPES) ×2 IMPLANT
ELECT COATED BLADE 2.86 ST (ELECTRODE) ×2 IMPLANT
ELECT REM PT RETURN 9FT ADLT (ELECTROSURGICAL) ×2
ELECTRODE REM PT RTRN 9FT ADLT (ELECTROSURGICAL) ×1 IMPLANT
GAUZE 4X4 16PLY ~~LOC~~+RFID DBL (SPONGE) IMPLANT
GLOVE BIO SURGEON STRL SZ7.5 (GLOVE) ×2 IMPLANT
GOWN STRL REUS W/ TWL LRG LVL3 (GOWN DISPOSABLE) ×4 IMPLANT
GOWN STRL REUS W/TWL LRG LVL3 (GOWN DISPOSABLE) ×4
NDL HYPO 25X1 1.5 SAFETY (NEEDLE) IMPLANT
NEEDLE HYPO 25X1 1.5 SAFETY (NEEDLE)
NS IRRIG 1000ML POUR BTL (IV SOLUTION) ×2 IMPLANT
PACK BASIN DAY SURGERY FS (CUSTOM PROCEDURE TRAY) ×2 IMPLANT
PENCIL SMOKE EVACUATOR (MISCELLANEOUS) ×1 IMPLANT
SLEEVE SCD COMPRESS KNEE MED (STOCKING) ×1 IMPLANT
SPIKE FLUID TRANSFER (MISCELLANEOUS) IMPLANT
SPONGE T-LAP 18X18 ~~LOC~~+RFID (SPONGE) ×1 IMPLANT
SUT CHROMIC 3 0 SH 27 (SUTURE) IMPLANT
SUT ETHILON 3 0 PS 1 (SUTURE) IMPLANT
SUT MON AB 4-0 PC3 18 (SUTURE) IMPLANT
SUT PROLENE 3 0 PS 2 (SUTURE) IMPLANT
SUT SILK 2 0 PERMA HAND 18 BK (SUTURE) ×1 IMPLANT
SUT VIC AB 3-0 54X BRD REEL (SUTURE) IMPLANT
SUT VIC AB 3-0 BRD 54 (SUTURE)
SUT VIC AB 3-0 FS2 27 (SUTURE) IMPLANT
SUT VIC AB 3-0 SH 27 (SUTURE)
SUT VIC AB 3-0 SH 27X BRD (SUTURE) IMPLANT
SUT VIC AB 4-0 PS2 18 (SUTURE) ×1 IMPLANT
SUT VIC AB 4-0 RB1 27 (SUTURE)
SUT VIC AB 4-0 RB1 27X BRD (SUTURE) IMPLANT
SUT VICRYL AB 3 0 TIES (SUTURE) IMPLANT
SYR CONTROL 10ML LL (SYRINGE) IMPLANT
TOWEL GREEN STERILE FF (TOWEL DISPOSABLE) ×3 IMPLANT
TUBE CONNECTING 20X1/4 (TUBING) IMPLANT
YANKAUER SUCT BULB TIP NO VENT (SUCTIONS) IMPLANT

## 2022-10-21 NOTE — Discharge Instructions (Signed)

## 2022-10-21 NOTE — Interval H&P Note (Signed)
History and Physical Interval Note:  10/21/2022 9:40 AM  Wendy [name]  has presented today for surgery, with the diagnosis of SKIN LESION RIGHT CALF.  The various methods of treatment have been discussed with the patient and family. After consideration of risks, benefits and other options for treatment, the patient has consented to  Procedure(s) with comments: EXCISION SKIN LESION RIGHT CALF (Right) - 45 as a surgical intervention.  The patient's history has been reviewed, patient examined, no change in status, stable for surgery.  I have reviewed the patient's chart and labs.  Questions were answered to the patient's satisfaction.     Chevis Pretty III

## 2022-10-21 NOTE — Op Note (Signed)
10/21/2022  10:28 AM  PATIENT:  Wendy [name]  82 y.o. female  PRE-OPERATIVE DIAGNOSIS:  SKIN LESION RIGHT CALF  POST-OPERATIVE DIAGNOSIS:  SKIN LESION RIGHT CALF  PROCEDURE:  Procedure(s) with comments: EXCISION SKIN LESION RIGHT CALF (Right) - 45  SURGEON:  Surgeons and Role:    Griselda Miner, MD - Primary  PHYSICIAN ASSISTANT:   ASSISTANTS: none   ANESTHESIA:   local  EBL:  minimal   BLOOD ADMINISTERED:none  DRAINS: none   LOCAL MEDICATIONS USED:  LIDOCAINE   SPECIMEN:  Source of Specimen:  skin lesion right leg  DISPOSITION OF SPECIMEN:  PATHOLOGY  COUNTS:  YES  TOURNIQUET:  * No tourniquets in log *  DICTATION: .Dragon Dictation  After informed consent was obtained the patient was brought to the operating room and placed in the supine position on the operating table.  The right leg was frog-legged into position.  The right calf was then prepped with ChloraPrep, allowed to dry, and draped in usual sterile manner.  An appropriate timeout was performed.  The area around the skin lesion was infiltrated with 1% lidocaine with epinephrine until a good field block was created.  An elliptical incision was made around the skin lesion and a vertically oriented manner.  The incision was carried through the skin and subcutaneous tissue sharply with a 15 blade knife until the entire lesion was removed.  The lesion was marked with a short stitch on the proximal surface and a long stitch on the posterior surface.  The lesion itself measured about 5 mm.  The skin excision measured about 1 cm x 2-1/2 cm.  The lesion was then sent to pathology for further evaluation.  Hemostasis was achieved using the eye cautery.  The incision was then closed with interrupted 4-0 Monocryl subcuticular stitches.  Dermabond dressings were applied.  The patient tolerated the procedure well.  At the end of the case all needle sponge and instrument counts were correct.  The patient was then awakened and taken  to recovery in stable condition.  PLAN OF CARE: Discharge to home after PACU  PATIENT DISPOSITION:  PACU - hemodynamically stable.   Delay start of Pharmacological VTE agent (>24hrs) due to surgical blood loss or risk of bleeding: not applicable

## 2022-10-21 NOTE — H&P (Signed)
REFERRING PHYSICIAN: Orpha Bur, MD PROVIDER: Lindell Noe, MD MRN: [id number] DOB: 09/23/1940 Subjective   Chief Complaint: New Consultation  History of Present Illness: Wendy [name] is a 82 y.o. female who is seen today as an office consultation for evaluation of New Consultation  We are asked to see the patient in consultation by Dr. Romona Curls to evaluate her for a skin lesion on her right leg. The patient is a 82 year old black female who first noticed the lesion about a month or 2 ago. Since she first noticed it it has lightened somewhat. It is raised. She denies any pain associated with it. She has picked at it but has not had any bleeding. She is not on any blood thinners. She has had a remote history of MI but has been stable and is followed by cardiology  Review of Systems: A complete review of systems was obtained from the patient. I have reviewed this information and discussed as appropriate with the patient. See HPI as well for other ROS.  ROS   Medical History: Past Medical History:  Diagnosis Date  Arthritis   Patient Active Problem List  Diagnosis  Skin lesion of right leg   Past Surgical History:  Procedure Laterality Date  ARTHROSCOPY SHOULDER Bilateral  CATARACT EXTRACTION    No Known Allergies  Current Outpatient Medications on File Prior to Visit  Medication Sig Dispense Refill  ascorbic acid, vitamin C, 500 mg Cap 2 capsules Orally once a day  losartan-hydroCHLOROthiazide (HYZAAR) 50-12.5 mg tablet 1 tablet Orally Once a day for 90 days  multivitamin tablet Take 1 tablet by mouth once daily  omega-3 fatty acids 1,000 mg capsule Take 1 capsule by mouth   No current facility-administered medications on file prior to visit.   Family History  Problem Relation Age of Onset  High blood pressure (Hypertension) Mother  Stroke Father  High blood pressure (Hypertension) Father  Diabetes Father  Coronary Artery Disease (Blocked arteries around  heart) Sister  Breast cancer Sister    Social History   Tobacco Use  Smoking Status Never  Smokeless Tobacco Never    Social History   Socioeconomic History  Marital status: Married  Tobacco Use  Smoking status: Never  Smokeless tobacco: Never  Substance and Sexual Activity  Alcohol use: Never  Drug use: Never   Social Determinants of Health   Financial Resource Strain: Low Risk (05/10/2020)  Received from [facility]  Overall Financial Resource Strain (CARDIA)  Difficulty of Paying Living Expenses: Not hard at all  Food Insecurity: No Food Insecurity (05/10/2020)  Received from [facility]  Hunger Vital Sign  Worried About Running Out of Food in the Last Year: Never true  Ran Out of Food in the Last Year: Never true  Transportation Needs: No Transportation Needs (05/10/2020)  Received from [facility] - Transportation  Lack of Transportation (Medical): No  Lack of Transportation (Non-Medical): No  Physical Activity: Insufficiently Active (05/10/2020)  Received from [facility]  Exercise Vital Sign  Days of Exercise per Week: 3 days  Minutes of Exercise per Session: 10 min  Stress: No Stress Concern Present (05/10/2020)  Received from [facility] - Occupational Stress Questionnaire  Feeling of Stress : Not at all  Social Connections: Socially Integrated (05/10/2020)  Received from [facility]  Social Connection and Isolation Panel [NHANES]  Frequency of Communication with Friends and Family: More than three times a week  Frequency of Social Gatherings  with Friends and Family: More than three times a week  Attends Religious Services: More than 4 times per year  Active Member of Clubs or Organizations: Yes  Attends Banker Meetings: 1 to 4 times per year  Marital Status: Married   Objective:   Vitals:  BP: 120/74  Pulse: 73  Temp: 36.7 C (98 F)  SpO2: 99%  Weight: 64.9 kg (143 lb)  Height: 149.9  cm (4\' 11" )  PainSc: 0-No pain   Body mass index is 28.88 kg/m.  Physical Exam Vitals reviewed.  Constitutional:  General: She is not in acute distress. Appearance: Normal appearance.  HENT:  Head: Normocephalic and atraumatic.  Right Ear: External ear normal.  Left Ear: External ear normal.  Nose: Nose normal.  Mouth/Throat:  Mouth: Mucous membranes are moist.  Pharynx: Oropharynx is clear.  Eyes:  General: No scleral icterus. Extraocular Movements: Extraocular movements intact.  Conjunctiva/sclera: Conjunctivae normal.  Pupils: Pupils are equal, round, and reactive to light.  Cardiovascular:  Rate and Rhythm: Normal rate and regular rhythm.  Pulses: Normal pulses.  Heart sounds: Murmur heard.  Pulmonary:  Effort: Pulmonary effort is normal. No respiratory distress.  Breath sounds: Normal breath sounds.  Abdominal:  General: Bowel sounds are normal.  Palpations: Abdomen is soft.  Tenderness: There is no abdominal tenderness.  Musculoskeletal:  General: No swelling, tenderness or deformity. Normal range of motion.  Cervical back: Normal range of motion and neck supple.  Skin: General: Skin is warm and dry.  Coloration: Skin is not jaundiced.  Comments: There is a 5 mm raised lesion that is darker than the surrounding skin on her right medial calf area. This area seems to be isolated to the skin. There are no satellite lesions that I can appreciate.  Neurological:  General: No focal deficit present.  Mental Status: She is alert and oriented to person, place, and time.  Psychiatric:  Mood and Affect: Mood normal.  Behavior: Behavior normal.     Labs, Imaging and Diagnostic Testing:  Assessment and Plan:   Diagnoses and all orders for this visit:  Skin lesion of right leg   The patient appears to have a 5 mm lesion on the right medial calf area. Because of its discoloration and raised nature there is concern that this could be some form of a send cancer.  Because of this I feel it would be beneficial to her to have this removed. She would also like to have this done. I have discussed with her in detail the risks and benefits of the operation as well as some of the technical aspects and she understands and wishes to proceed. I believe this could be done under local anesthesia in the outpatient surgery center.

## 2022-10-22 ENCOUNTER — Encounter (HOSPITAL_BASED_OUTPATIENT_CLINIC_OR_DEPARTMENT_OTHER): Payer: Self-pay | Admitting: General Surgery

## 2022-10-22 ENCOUNTER — Ambulatory Visit: Payer: Medicare Other | Admitting: Cardiology

## 2022-10-22 VITALS — BP 138/70 | HR 52 | Resp 16 | Ht <= 58 in | Wt 142.0 lb

## 2022-10-22 DIAGNOSIS — R262 Difficulty in walking, not elsewhere classified: Secondary | ICD-10-CM | POA: Diagnosis not present

## 2022-10-22 DIAGNOSIS — R001 Bradycardia, unspecified: Secondary | ICD-10-CM

## 2022-10-22 DIAGNOSIS — R931 Abnormal findings on diagnostic imaging of heart and coronary circulation: Secondary | ICD-10-CM

## 2022-10-22 DIAGNOSIS — I10 Essential (primary) hypertension: Secondary | ICD-10-CM

## 2022-10-22 DIAGNOSIS — M5416 Radiculopathy, lumbar region: Secondary | ICD-10-CM | POA: Diagnosis not present

## 2022-10-22 DIAGNOSIS — M2569 Stiffness of other specified joint, not elsewhere classified: Secondary | ICD-10-CM | POA: Diagnosis not present

## 2022-10-22 DIAGNOSIS — R072 Precordial pain: Secondary | ICD-10-CM

## 2022-10-22 DIAGNOSIS — M62551 Muscle wasting and atrophy, not elsewhere classified, right thigh: Secondary | ICD-10-CM | POA: Diagnosis not present

## 2022-10-22 NOTE — Progress Notes (Signed)
ID:  Wendy [name], DOB 01-May-1940, MRN [id number]  PCP:  Shireen Quan, DO  Cardiologist:  Tessa Lerner, DO, [facility] (established care 09/05/2019)  Date: 10/22/22 Last Office Visit: 10/17/2021  Chief Complaint  Patient presents with   Bradycardia   Follow-up    HPI  Wendy [name] is a 82 y.o. female whose past medical history and cardiovascular risk factors include: Mild coronary artery calcification, Hx of COVID 19 (July 2022), Sinus bradycardia, hypertension, postmenopausal female, advanced age.  Patient was referred to practice for evaluation of bradycardia.  She underwent appropriate cardiovascular workup and was noted to have good chronotropic competence.  She is here for a 1 year follow-up visit.  Over the last year patient denies any anginal chest pain or heart failure symptoms.  Overall functional capacity remains stable.  No near-syncope or syncopal events.  ALLERGIES: No Known Allergies  MEDICATION LIST PRIOR TO VISIT: Current Meds  Medication Sig   Ascorbic Acid (VITAMIN C) 500 MG CAPS 2 capsules   aspirin EC 81 MG tablet Take 81 mg by mouth daily.   cholecalciferol (VITAMIN D3) 25 MCG (1000 UNIT) tablet Take 1,000 Units by mouth daily.   Glucosamine 500 MG CAPS in the morning and at bedtime.   losartan-hydrochlorothiazide (HYZAAR) 50-12.5 MG tablet Take 1 tablet by mouth daily.   Multiple Vitamin (MULTIVITAMIN) tablet Take 1 tablet by mouth daily.   Omega-3 Fatty Acids (FISH OIL OMEGA-3 PO) Take 1 capsule by mouth.   oxyCODONE (ROXICODONE) 5 MG immediate release tablet Take 1 tablet (5 mg total) by mouth every 6 (six) hours as needed for severe pain.     PAST MEDICAL HISTORY: Past Medical History:  Diagnosis Date   Arthritis    hands, neck lower back   Cataract    extracted   GERD (gastroesophageal reflux disease)    prior med   Hypertension    Lumbar stenosis     PAST SURGICAL HISTORY: Past Surgical History:  Procedure Laterality Date   CATARACT EXTRACTION,  BILATERAL     LESION REMOVAL Right 10/21/2022   Procedure: EXCISION SKIN LESION RIGHT CALF;  Surgeon: Griselda Miner, MD;  Location: [facility];  Service: General;  Laterality: Right;  46   SHOULDER ARTHROSCOPY Right    SHOULDER ARTHROTOMY Left    about 2007    FAMILY HISTORY: The patient family history includes Arthritis in her mother; COPD in her mother; Cancer in her brother, maternal aunt, and sister; Diabetes in her father and son; Hearing loss in her mother; Heart attack in her sister; Heart disease in her sister; Hyperlipidemia in her sister; Hypertension in her mother and sister; Mental illness in her brother; Stroke (age of onset: 28) in her father; Thyroid disease in her daughter.  SOCIAL HISTORY:  The patient  reports that she has never smoked. She has never used smokeless tobacco. She reports that she does not drink alcohol and does not use drugs.  REVIEW OF SYSTEMS: Review of Systems  Cardiovascular:  Negative for chest pain, claudication, dyspnea on exertion, irregular heartbeat, leg swelling, near-syncope, orthopnea, palpitations, paroxysmal nocturnal dyspnea and syncope.  Respiratory:  Negative for shortness of breath.   Hematologic/Lymphatic: Negative for bleeding problem.  Musculoskeletal:  Negative for muscle cramps and myalgias.  Neurological:  Negative for dizziness and light-headedness.    PHYSICAL EXAM:    10/22/2022    2:38 PM 10/21/2022   10:41 AM 10/21/2022    8:59 AM  Vitals with BMI  Height 4\' 10"   Weight 142 lbs    BMI 29.69    Systolic 138 121 161  Diastolic 70 65 76  Pulse 52 54 57   Physical Exam  Constitutional: No distress.  Age appropriate, hemodynamically stable.   Neck: No JVD present.  Cardiovascular: Regular rhythm, S1 normal, S2 normal and intact distal pulses. Bradycardia present. Exam reveals no gallop, no S3 and no S4.  No murmur heard. Pulses:      Dorsalis pedis pulses are 1+ on the right side and 1+ on the left  side.       Posterior tibial pulses are 2+ on the right side and 2+ on the left side.  Pulmonary/Chest: Effort normal and breath sounds normal. No stridor. She has no wheezes. She has no rales.  Abdominal: Soft. Bowel sounds are normal. She exhibits no distension. There is no abdominal tenderness.  Musculoskeletal:        General: No edema.     Cervical back: Neck supple.  Neurological: She is alert and oriented to person, place, and time. She has intact cranial nerves (2-12).  Skin: Skin is warm and moist.   CARDIAC DATABASE: EKG: 10/22/2022: Sinus bradycardia, 51 bpm, without underlying injury pattern.  Echocardiogram: 11/14/2021: Normal LV systolic function with visual EF 60-65%. Left ventricle cavity is normal in size. Normal left ventricular wall thickness. Normal global wall motion. Normal diastolic filling pattern, normal LAP.  Mild tricuspid regurgitation. No evidence of pulmonary hypertension. RVSP measures 30 mmHg. Compared to 09/08/2019: Mild MR is now not appreciated otherwise no significant change   Stress Testing: Exercise Myoview stress test 09/25/2019: Exercise nuclear stress test was performed using Bruce protocol. Patient reached 7.7 METS, and 91% of age predicted maximum heart rate. Exercise capacity was fair. Chest pain not reported. Normal heart rate and hemodynamic response.  Peak EKG/ECG demonstrated sinus tachycardia. occasional PAC's, 1 mm horizontal ST depression present in leads II, III, aVF, V4-V6. EKG changes normalized within 1 min into recovery. Given norma myocardial perfusion, EKG changes are likely false positive.  Normal myocardial perfusion. Stress LVEF 69%. Low risk study.  Heart Catheterization: None  Carotid artery duplex  09/07/2019: Peak systolic velocities in the right bifurcation, internal, external and common carotid arteries are within normal limits. Minimal stenosis in the left internal carotid artery (minimal) with homogeneous  plaque. Antegrade right vertebral artery flow. Antegrade left vertebral artery flow.  24 hour Holter monitor: Dominant rhythm normal sinus. Heart rate 42-154 bpm.  Average heart rate 61 bpm. Minimum heart rate of 42 bpm occurred at 4:10am on 09/09/2019.  No atrial fibrillation/ventricular tachycardia/high grade AV block, sinus pause greater than or equal to 3 seconds in duration. Total ventricular ectopic burden <1%. Total supraventricular ectopic burden <1%. One auto detected episode of supraventricular tachycardia was 11 beats in duration at a maximum rate of 154 bpm. Number of patient triggered events: 2.  Underlying rhythm is normal sinus without any significant dysrhythmias.  CT Cardiac Scoring: 10/23/2021 Total CAC 31.7 AU, 44th percentile for patient's age, sex, and race. Aortic atherosclerosis Noncardiac findings: None   LABORATORY DATA:    Latest Ref Rng & Units 05/13/2022   11:02 AM 10/09/2021    9:56 AM 05/05/2021   11:08 AM  CBC  WBC 4.0 - 10.5 K/uL 2.2  3.0  3.1   Hemoglobin 12.0 - 15.0 g/dL 09.6  04.5  40.9   Hematocrit 36.0 - 46.0 % 38.1  36.9  37.6   Platelets 150 - 400 K/uL 141  150  178        Latest Ref Rng & Units 10/09/2021    9:56 AM 09/20/2018    2:21 PM 01/18/2017   12:57 PM  CMP  Glucose 70 - 99 mg/dL 161  81  84   BUN 8 - 23 mg/dL 22  14  17    Creatinine 0.44 - 1.00 mg/dL 0.96  0.45  4.09   Sodium 135 - 145 mmol/L 137  140  140   Potassium 3.5 - 5.1 mmol/L 3.7  3.6  4.0   Chloride 98 - 111 mmol/L 101  100  102   CO2 22 - 32 mmol/L 27  25  31    Calcium 8.9 - 10.3 mg/dL 9.5  81.1  9.9   Total Protein 6.0 - 8.5 g/dL  7.2  7.1   Total Bilirubin 0.0 - 1.2 mg/dL  0.8  1.3   Alkaline Phos 39 - 117 IU/L  49    AST 0 - 40 IU/L  22  24   ALT 0 - 32 IU/L  20  20     Lipid Panel     Component Value Date/Time   CHOL 186 01/18/2017 1257   TRIG 49 01/18/2017 1257   HDL 128 01/18/2017 1257   CHOLHDL 1.5 01/18/2017 1257   LDLCALC 44 01/18/2017 1257     No components found for: "NTPROBNP" No results for input(s): "PROBNP" in the last 8760 hours. No results for input(s): "TSH" in the last 8760 hours.  BMP No results for input(s): "NA", "K", "CL", "CO2", "GLUCOSE", "BUN", "CREATININE", "CALCIUM", "GFRNONAA", "GFRAA" in the last 8760 hours.   HEMOGLOBIN A1C Lab Results  Component Value Date   HGBA1C 5.6% 05/22/2017   External Labs: Collected: 08/14/2019 Creatinine 1.02 mg/dL. eGFR: 63 mL/min per 1.73 m TSH: 0.81   External Labs: Collected: 10/16/2021 provided by primary physician. BUN 23, creatinine 1.11. eGFR 50. Sodium 139, potassium 3.9, chloride 102, bicarb 29  Collected: 11/13/2021 Hemoglobin 13.9 g/dL, hematocrit 91.4% BUN 16, creatinine 0.95. Sodium 141, potassium 4.1, chloride 99, bicarb 33. AST 26, ALT 25. TSH 2.43 Total cholesterol 139, triglycerides 82, HDL 52, LDL 71, non-HDL 87  IMPRESSION:    ICD-10-CM   1. Sinus bradycardia  R00.1 EKG 12-Lead    2. Agatston coronary artery calcium score less than 100  R93.1     3. Benign hypertension  I10        RECOMMENDATIONS: Zelna Poarch is a 82 y.o. female whose past medical history and cardiac risk factors include: Mild coronary artery calcification, Hx of COVID 19 (July 2022), Sinus bradycardia, hypertension, postmenopausal female, advanced age.  Sinus bradycardia Asymptomatic. In the past as illustrated good chronotropic competence on stress testing. No additional testing warranted at this time as long as she remains asymptomatic.  Agatston coronary artery calcium score less than 100 Already on aspirin 81 mg p.o. daily. Last LDL 71 mg/dL. In the past has been reluctant to be on statin therapy. She will have repeat labs with PCP and will send [place] a copy for her records.  Benign hypertension Offered blood pressure well-controlled. No changes warranted at this time  Reemphasized the importance of  improving her modifiable cardiovascular risk factors  such as glycemic control, lipid management, blood pressure control, weight loss.  FINAL MEDICATION LIST END OF ENCOUNTER: No orders of the defined types were placed in this encounter.   Current Outpatient Medications:    Ascorbic Acid (VITAMIN C) 500 MG CAPS, 2 capsules, Disp: , Rfl:  aspirin EC 81 MG tablet, Take 81 mg by mouth daily., Disp: , Rfl:    cholecalciferol (VITAMIN D3) 25 MCG (1000 UNIT) tablet, Take 1,000 Units by mouth daily., Disp: , Rfl:    Glucosamine 500 MG CAPS, in the morning and at bedtime., Disp: , Rfl:    losartan-hydrochlorothiazide (HYZAAR) 50-12.5 MG tablet, Take 1 tablet by mouth daily., Disp: , Rfl:    Multiple Vitamin (MULTIVITAMIN) tablet, Take 1 tablet by mouth daily., Disp: , Rfl:    Omega-3 Fatty Acids (FISH OIL OMEGA-3 PO), Take 1 capsule by mouth., Disp: , Rfl:    oxyCODONE (ROXICODONE) 5 MG immediate release tablet, Take 1 tablet (5 mg total) by mouth every 6 (six) hours as needed for severe pain., Disp: 5 tablet, Rfl: 0  Orders Placed This Encounter  Procedures   EKG 12-Lead    --Continue cardiac medications as reconciled in final medication list. --Return in about 1 year (around 10/22/2023) for Annual follow up visit. Or sooner if needed. --Continue follow-up with your primary care physician regarding the management of your other chronic comorbid conditions.  Patient's questions and concerns were addressed to her satisfaction. She voices understanding of the instructions provided during this encounter.   This note was created using a voice recognition software as a result there may be grammatical errors inadvertently enclosed that do not reflect the nature of this encounter. Every attempt is made to correct such errors.  Tessa Lerner, [place], [facility]  Pager: [phone] Office: [phone]

## 2022-10-27 DIAGNOSIS — M5416 Radiculopathy, lumbar region: Secondary | ICD-10-CM | POA: Diagnosis not present

## 2022-10-27 DIAGNOSIS — R262 Difficulty in walking, not elsewhere classified: Secondary | ICD-10-CM | POA: Diagnosis not present

## 2022-10-27 DIAGNOSIS — M62551 Muscle wasting and atrophy, not elsewhere classified, right thigh: Secondary | ICD-10-CM | POA: Diagnosis not present

## 2022-10-27 DIAGNOSIS — M2569 Stiffness of other specified joint, not elsewhere classified: Secondary | ICD-10-CM | POA: Diagnosis not present

## 2022-10-28 ENCOUNTER — Encounter (HOSPITAL_BASED_OUTPATIENT_CLINIC_OR_DEPARTMENT_OTHER): Payer: Self-pay | Admitting: General Surgery

## 2022-10-28 DIAGNOSIS — M13861 Other specified arthritis, right knee: Secondary | ICD-10-CM | POA: Diagnosis not present

## 2022-10-29 DIAGNOSIS — R262 Difficulty in walking, not elsewhere classified: Secondary | ICD-10-CM | POA: Diagnosis not present

## 2022-10-29 DIAGNOSIS — M5416 Radiculopathy, lumbar region: Secondary | ICD-10-CM | POA: Diagnosis not present

## 2022-10-29 DIAGNOSIS — M62551 Muscle wasting and atrophy, not elsewhere classified, right thigh: Secondary | ICD-10-CM | POA: Diagnosis not present

## 2022-10-29 DIAGNOSIS — M2569 Stiffness of other specified joint, not elsewhere classified: Secondary | ICD-10-CM | POA: Diagnosis not present

## 2022-10-29 LAB — SURGICAL PATHOLOGY

## 2022-11-03 DIAGNOSIS — M2569 Stiffness of other specified joint, not elsewhere classified: Secondary | ICD-10-CM | POA: Diagnosis not present

## 2022-11-03 DIAGNOSIS — M5416 Radiculopathy, lumbar region: Secondary | ICD-10-CM | POA: Diagnosis not present

## 2022-11-03 DIAGNOSIS — M62551 Muscle wasting and atrophy, not elsewhere classified, right thigh: Secondary | ICD-10-CM | POA: Diagnosis not present

## 2022-11-03 DIAGNOSIS — R262 Difficulty in walking, not elsewhere classified: Secondary | ICD-10-CM | POA: Diagnosis not present

## 2022-11-05 DIAGNOSIS — M5416 Radiculopathy, lumbar region: Secondary | ICD-10-CM | POA: Diagnosis not present

## 2022-11-05 DIAGNOSIS — M2569 Stiffness of other specified joint, not elsewhere classified: Secondary | ICD-10-CM | POA: Diagnosis not present

## 2022-11-05 DIAGNOSIS — R262 Difficulty in walking, not elsewhere classified: Secondary | ICD-10-CM | POA: Diagnosis not present

## 2022-11-05 DIAGNOSIS — M62551 Muscle wasting and atrophy, not elsewhere classified, right thigh: Secondary | ICD-10-CM | POA: Diagnosis not present

## 2022-11-10 DIAGNOSIS — M25661 Stiffness of right knee, not elsewhere classified: Secondary | ICD-10-CM | POA: Diagnosis not present

## 2022-11-10 DIAGNOSIS — M62561 Muscle wasting and atrophy, not elsewhere classified, right lower leg: Secondary | ICD-10-CM | POA: Diagnosis not present

## 2022-11-10 DIAGNOSIS — M25561 Pain in right knee: Secondary | ICD-10-CM | POA: Diagnosis not present

## 2022-11-10 DIAGNOSIS — R2689 Other abnormalities of gait and mobility: Secondary | ICD-10-CM | POA: Diagnosis not present

## 2022-11-11 DIAGNOSIS — M62561 Muscle wasting and atrophy, not elsewhere classified, right lower leg: Secondary | ICD-10-CM | POA: Diagnosis not present

## 2022-11-11 DIAGNOSIS — R2689 Other abnormalities of gait and mobility: Secondary | ICD-10-CM | POA: Diagnosis not present

## 2022-11-11 DIAGNOSIS — M25561 Pain in right knee: Secondary | ICD-10-CM | POA: Diagnosis not present

## 2022-11-11 DIAGNOSIS — M25661 Stiffness of right knee, not elsewhere classified: Secondary | ICD-10-CM | POA: Diagnosis not present

## 2022-11-13 ENCOUNTER — Inpatient Hospital Stay: Payer: Medicare Other | Attending: Physician Assistant

## 2022-11-13 DIAGNOSIS — M25561 Pain in right knee: Secondary | ICD-10-CM | POA: Diagnosis not present

## 2022-11-13 DIAGNOSIS — M25661 Stiffness of right knee, not elsewhere classified: Secondary | ICD-10-CM | POA: Diagnosis not present

## 2022-11-13 DIAGNOSIS — M62561 Muscle wasting and atrophy, not elsewhere classified, right lower leg: Secondary | ICD-10-CM | POA: Diagnosis not present

## 2022-11-13 DIAGNOSIS — D72819 Decreased white blood cell count, unspecified: Secondary | ICD-10-CM | POA: Insufficient documentation

## 2022-11-13 DIAGNOSIS — R2689 Other abnormalities of gait and mobility: Secondary | ICD-10-CM | POA: Diagnosis not present

## 2022-11-17 ENCOUNTER — Inpatient Hospital Stay: Payer: Medicare Other

## 2022-11-17 DIAGNOSIS — R2689 Other abnormalities of gait and mobility: Secondary | ICD-10-CM | POA: Diagnosis not present

## 2022-11-17 DIAGNOSIS — D72819 Decreased white blood cell count, unspecified: Secondary | ICD-10-CM | POA: Diagnosis not present

## 2022-11-17 DIAGNOSIS — M62561 Muscle wasting and atrophy, not elsewhere classified, right lower leg: Secondary | ICD-10-CM | POA: Diagnosis not present

## 2022-11-17 DIAGNOSIS — M25661 Stiffness of right knee, not elsewhere classified: Secondary | ICD-10-CM | POA: Diagnosis not present

## 2022-11-17 DIAGNOSIS — D708 Other neutropenia: Secondary | ICD-10-CM

## 2022-11-17 DIAGNOSIS — M25561 Pain in right knee: Secondary | ICD-10-CM | POA: Diagnosis not present

## 2022-11-17 LAB — COMPREHENSIVE METABOLIC PANEL
ALT: 26 U/L (ref 0–44)
AST: 24 U/L (ref 15–41)
Albumin: 4.1 g/dL (ref 3.5–5.0)
Alkaline Phosphatase: 42 U/L (ref 38–126)
Anion gap: 11 (ref 5–15)
BUN: 24 mg/dL — ABNORMAL HIGH (ref 8–23)
CO2: 29 mmol/L (ref 22–32)
Calcium: 9.7 mg/dL (ref 8.9–10.3)
Chloride: 98 mmol/L (ref 98–111)
Creatinine, Ser: 1.07 mg/dL — ABNORMAL HIGH (ref 0.44–1.00)
GFR, Estimated: 52 mL/min — ABNORMAL LOW (ref >60)
Glucose, Bld: 92 mg/dL (ref 70–99)
Potassium: 3.5 mmol/L (ref 3.5–5.1)
Sodium: 138 mmol/L (ref 135–145)
Total Bilirubin: 1.4 mg/dL — ABNORMAL HIGH (ref 0.3–1.2)
Total Protein: 7 g/dL (ref 6.5–8.1)

## 2022-11-17 LAB — CBC WITH DIFFERENTIAL/PLATELET
Abs Immature Granulocytes: 0 10*3/uL (ref 0.00–0.07)
Basophils Absolute: 0 10*3/uL (ref 0.0–0.1)
Basophils Relative: 1 %
Eosinophils Absolute: 0.2 10*3/uL (ref 0.0–0.5)
Eosinophils Relative: 5 %
HCT: 39.4 % (ref 36.0–46.0)
Hemoglobin: 13.2 g/dL (ref 12.0–15.0)
Immature Granulocytes: 0 %
Lymphocytes Relative: 33 %
Lymphs Abs: 1 10*3/uL (ref 0.7–4.0)
MCH: 30.1 pg (ref 26.0–34.0)
MCHC: 33.5 g/dL (ref 30.0–36.0)
MCV: 90 fL (ref 80.0–100.0)
Monocytes Absolute: 0.4 10*3/uL (ref 0.1–1.0)
Monocytes Relative: 13 %
Neutro Abs: 1.4 10*3/uL — ABNORMAL LOW (ref 1.7–7.7)
Neutrophils Relative %: 48 %
Platelets: 174 10*3/uL (ref 150–400)
RBC: 4.38 MIL/uL (ref 3.87–5.11)
RDW: 12.4 % (ref 11.5–15.5)
WBC: 2.9 10*3/uL — ABNORMAL LOW (ref 4.0–10.5)
nRBC: 0 % (ref 0.0–0.2)

## 2022-11-17 LAB — VITAMIN B12: Vitamin B-12: 580 pg/mL (ref 180–914)

## 2022-11-17 LAB — FOLATE: Folate: 37.8 ng/mL (ref >5.9)

## 2022-11-18 LAB — KAPPA/LAMBDA LIGHT CHAINS
Kappa free light chain: 21.4 mg/L — ABNORMAL HIGH (ref 3.3–19.4)
Kappa, lambda light chain ratio: 2.55 — ABNORMAL HIGH (ref 0.26–1.65)
Lambda free light chains: 8.4 mg/L (ref 5.7–26.3)

## 2022-11-19 ENCOUNTER — Inpatient Hospital Stay: Payer: Medicare Other | Admitting: Oncology

## 2022-11-19 DIAGNOSIS — R2689 Other abnormalities of gait and mobility: Secondary | ICD-10-CM | POA: Diagnosis not present

## 2022-11-19 DIAGNOSIS — M62561 Muscle wasting and atrophy, not elsewhere classified, right lower leg: Secondary | ICD-10-CM | POA: Diagnosis not present

## 2022-11-19 DIAGNOSIS — M25561 Pain in right knee: Secondary | ICD-10-CM | POA: Diagnosis not present

## 2022-11-19 DIAGNOSIS — M25661 Stiffness of right knee, not elsewhere classified: Secondary | ICD-10-CM | POA: Diagnosis not present

## 2022-11-19 LAB — COPPER, SERUM: Copper: 105 ug/dL (ref 80–158)

## 2022-11-19 LAB — METHYLMALONIC ACID, SERUM: Methylmalonic Acid, Quantitative: 174 nmol/L (ref 0–378)

## 2022-11-22 LAB — PROTEIN ELECTROPHORESIS, SERUM
A/G Ratio: 1.4 (ref 0.7–1.7)
Albumin ELP: 4 g/dL (ref 2.9–4.4)
Alpha-1-Globulin: 0.3 g/dL (ref 0.0–0.4)
Alpha-2-Globulin: 0.7 g/dL (ref 0.4–1.0)
Beta Globulin: 0.9 g/dL (ref 0.7–1.3)
Gamma Globulin: 1.1 g/dL (ref 0.4–1.8)
Globulin, Total: 2.9 g/dL (ref 2.2–3.9)
Total Protein ELP: 6.9 g/dL (ref 6.0–8.5)

## 2022-11-23 ENCOUNTER — Inpatient Hospital Stay: Payer: Medicare Other | Admitting: Physician Assistant

## 2022-11-23 NOTE — Progress Notes (Unsigned)
[facility] [address], [place] [address]   CLINIC:  Medical Oncology/Hematology  PCP:  Shireen Quan, DO [address] [place] [address] [phone]   REASON FOR VISIT:  Follow-up for leukopenia  PRIOR THERAPY: None  CURRENT THERAPY: Surveillance  INTERVAL HISTORY:   Wendy [name] 82 y.o. female returns for routine follow-up of leukopenia.  She was last seen by Rojelio Brenner PA-C on 05/20/2022.  At today's visit, she reports feeling well.  No recent hospitalizations, surgeries, or changes in baseline health status.  She has not had any infections since her COVID-19 infection in February 2024.   She denies any B symptoms, abnormal rashes, masses, or lymphadenopathy.   She has 90% energy and 100% appetite. She endorses that she is maintaining a stable weight.  ASSESSMENT & PLAN:  1.  Chronic leukopenia - Has been following with hematology since 1999. - BMBx in 1999 suggested dysplasia with some refractory anemia, although her hemoglobin was quite normal.  She was reportedly given Neupogen injections prior to surgical procedures. - She then saw a hematologist in [place] and had another bone marrow biopsy which showed no evidence of dysplasia, normocellular marrow, and normal cytogenetics. - She was thought to have benign neutropenia. - No signs or symptoms of connective tissue disorder.   - She denies any B symptoms.  COVID infection in February 2024, otherwise no recent or recurrent infections. - No palpable adenopathy on exam today.   - Most recent labs (11/17/2022): WBC 2.9 with ANC 1.4 SPEP negative for M spike.  Light chains overall unremarkable with minimally elevated FLC ratio 2.55, mildly elevated kappa 21.4, normal lambda 8.4. Normal copper, folate, B12, MMA CMP unremarkable with mild CKD (creatinine 1.07/GFR 52) - PLAN: Repeat CBC/D and LDH with RTC in 1 year.  We did discuss possible discharge to PCP due to long-term stability.  She  would like to continue seeing [place] annually at this time.  2.  Other history - Family history includes a sister deceased from unspecified acute leukemia in her early 21's   -- She has three children and several grandchildren.  She has been married to her husband for 60+ years.  She is a retired Barrister's clerk.  PLAN SUMMARY: >> Same-day labs (CBC/D, LDH) + OFFICE visit in 1 year      REVIEW OF SYSTEMS:   Review of Systems  Constitutional:  Negative for appetite change, chills, diaphoresis, fatigue, fever and unexpected weight change.  HENT:   Negative for lump/mass and nosebleeds.   Eyes:  Negative for eye problems.  Respiratory:  Negative for cough, hemoptysis and shortness of breath.   Cardiovascular:  Negative for chest pain, leg swelling and palpitations.  Gastrointestinal:  Negative for abdominal pain, blood in stool, constipation, diarrhea, nausea and vomiting.  Genitourinary:  Negative for hematuria.   Skin: Negative.   Neurological:  Positive for headaches. Negative for dizziness and light-headedness.  Hematological:  Does not bruise/bleed easily.     PHYSICAL EXAM:  ECOG PERFORMANCE STATUS: 0 - Asymptomatic  There were no vitals filed for this visit. There were no vitals filed for this visit. Physical Exam Constitutional:      Appearance: Normal appearance. She is normal weight.  Cardiovascular:     Heart sounds: Normal heart sounds.  Pulmonary:     Breath sounds: Normal breath sounds.  Lymphadenopathy:     Head:     Right side of head: No submental, submandibular, tonsillar, preauricular, posterior auricular  or occipital adenopathy.     Left side of head: No submental, submandibular, tonsillar, preauricular, posterior auricular or occipital adenopathy.     Cervical: No cervical adenopathy.  Neurological:     General: No focal deficit present.     Mental Status: Mental status is at baseline.  Psychiatric:        Behavior: Behavior normal. Behavior is cooperative.      PAST MEDICAL/SURGICAL HISTORY:  Past Medical History:  Diagnosis Date   Arthritis    hands, neck lower back   Cataract    extracted   GERD (gastroesophageal reflux disease)    prior med   Hypertension    Lumbar stenosis    Past Surgical History:  Procedure Laterality Date   CATARACT EXTRACTION, BILATERAL     LESION REMOVAL Right 10/21/2022   Procedure: EXCISION SKIN LESION RIGHT CALF;  Surgeon: Griselda Miner, MD;  Location: [facility];  Service: General;  Laterality: Right;  46   SHOULDER ARTHROSCOPY Right    SHOULDER ARTHROTOMY Left    about 2007    SOCIAL HISTORY:  Social History   Socioeconomic History   Marital status: Married    Spouse name: Chrissie Noa   Number of children: 3   Years of education: Not on file   Highest education level: Associate degree: academic program  Occupational History   Occupation: retired    Comment: OR Engineer, civil (consulting) - RN  Tobacco Use   Smoking status: Never   Smokeless tobacco: Never  Vaping Use   Vaping status: Never Used  Substance and Sexual Activity   Alcohol use: No   Drug use: No   Sexual activity: Not Currently  Other Topics Concern   Not on file  Social History Narrative   Retired Charity fundraiser   Lives with Husband Chrissie Noa   moved to [place] to be near children   Daughter is Isaiah Serge   Social Determinants of Health   Financial Resource Strain: Low Risk  (05/10/2020)   Overall Financial Resource Strain (CARDIA)    Difficulty of Paying Living Expenses: Not hard at all  Food Insecurity: No Food Insecurity (05/10/2020)   Hunger Vital Sign    Worried About Running Out of Food in the Last Year: Never true    Ran Out of Food in the Last Year: Never true  Transportation Needs: No Transportation Needs (05/10/2020)   PRAPARE - Administrator, Civil Service (Medical): No    Lack of Transportation (Non-Medical): No  Physical Activity: Insufficiently Active (05/10/2020)   Exercise Vital Sign    Days of Exercise per  Week: 3 days    Minutes of Exercise per Session: 10 min  Stress: No Stress Concern Present (05/10/2020)   [employer] of Occupational Health - Occupational Stress Questionnaire    Feeling of Stress : Not at all  Social Connections: Socially Integrated (05/10/2020)   Social Connection and Isolation Panel [NHANES]    Frequency of Communication with Friends and Family: More than three times a week    Frequency of Social Gatherings with Friends and Family: More than three times a week    Attends Religious Services: More than 4 times per year    Active Member of [employer] or Organizations: Yes    Attends Banker Meetings: 1 to 4 times per year    Marital Status: Married  Catering manager Violence: Not At Risk (05/10/2020)   Humiliation, Afraid, Rape, and Kick questionnaire    Fear of Current or  Ex-Partner: No    Emotionally Abused: No    Physically Abused: No    Sexually Abused: No    FAMILY HISTORY:  Family History  Problem Relation Age of Onset   COPD Mother    Arthritis Mother    Hearing loss Mother    Hypertension Mother    Stroke Father 43   Diabetes Father    Heart disease Sister    Hyperlipidemia Sister    Hypertension Sister    Heart attack Sister    Cancer Sister        Leukemia   Cancer Brother        prostate   Mental illness Brother        PTSD from war   Thyroid disease Daughter    Diabetes Son    Cancer Maternal Aunt        breast    CURRENT MEDICATIONS:  Outpatient Encounter Medications as of 11/24/2022  Medication Sig   Ascorbic Acid (VITAMIN C) 500 MG CAPS 2 capsules   aspirin EC 81 MG tablet Take 81 mg by mouth daily.   cholecalciferol (VITAMIN D3) 25 MCG (1000 UNIT) tablet Take 1,000 Units by mouth daily.   Glucosamine 500 MG CAPS in the morning and at bedtime.   losartan-hydrochlorothiazide (HYZAAR) 50-12.5 MG tablet Take 1 tablet by mouth daily.   Multiple Vitamin (MULTIVITAMIN) tablet Take 1 tablet by mouth daily.   Omega-3 Fatty  Acids (FISH OIL OMEGA-3 PO) Take 1 capsule by mouth.   oxyCODONE (ROXICODONE) 5 MG immediate release tablet Take 1 tablet (5 mg total) by mouth every 6 (six) hours as needed for severe pain.   No facility-administered encounter medications on file as of 11/24/2022.    ALLERGIES:  No Known Allergies  LABORATORY DATA:  I have reviewed the labs as listed.  CBC    Component Value Date/Time   WBC 2.9 (L) 11/17/2022 1153   RBC 4.38 11/17/2022 1153   HGB 13.2 11/17/2022 1153   HGB 12.9 09/20/2018 1421   HCT 39.4 11/17/2022 1153   HCT 36.5 05/10/2020 1029   PLT 174 11/17/2022 1153   PLT 181 09/20/2018 1421   MCV 90.0 11/17/2022 1153   MCV 88 09/20/2018 1421   MCH 30.1 11/17/2022 1153   MCHC 33.5 11/17/2022 1153   RDW 12.4 11/17/2022 1153   RDW 12.1 09/20/2018 1421   LYMPHSABS 1.0 11/17/2022 1153   LYMPHSABS 1.1 09/20/2018 1421   MONOABS 0.4 11/17/2022 1153   EOSABS 0.2 11/17/2022 1153   EOSABS 0.1 09/20/2018 1421   BASOSABS 0.0 11/17/2022 1153   BASOSABS 0.0 09/20/2018 1421      Latest Ref Rng & Units 11/17/2022   11:53 AM 10/09/2021    9:56 AM 09/20/2018    2:21 PM  CMP  Glucose 70 - 99 mg/dL 92  161  81   BUN 8 - 23 mg/dL 24  22  14    Creatinine 0.44 - 1.00 mg/dL 0.96  0.45  4.09   Sodium 135 - 145 mmol/L 138  137  140   Potassium 3.5 - 5.1 mmol/L 3.5  3.7  3.6   Chloride 98 - 111 mmol/L 98  101  100   CO2 22 - 32 mmol/L 29  27  25    Calcium 8.9 - 10.3 mg/dL 9.7  9.5  81.1   Total Protein 6.5 - 8.1 g/dL 7.0   7.2   Total Bilirubin 0.3 - 1.2 mg/dL 1.4   0.8   Alkaline Phos  38 - 126 U/L 42   49   AST 15 - 41 U/L 24   22   ALT 0 - 44 U/L 26   20     DIAGNOSTIC IMAGING:  I have independently reviewed the relevant imaging and discussed with the patient.   WRAP UP:  All questions were answered. The patient knows to call the clinic with any problems, questions or concerns.  Medical decision making: Low  Time spent on visit: I spent 15 minutes counseling the patient  face to face. The total time spent in the appointment was 22 minutes and more than 50% was on counseling.  Carnella Guadalajara, PA-C  11/24/22 3:24 PM

## 2022-11-24 ENCOUNTER — Inpatient Hospital Stay: Payer: Medicare Other | Attending: Physician Assistant | Admitting: Physician Assistant

## 2022-11-24 VITALS — BP 126/67 | HR 56 | Temp 98.1°F | Resp 18 | Wt 143.8 lb

## 2022-11-24 DIAGNOSIS — D72819 Decreased white blood cell count, unspecified: Secondary | ICD-10-CM | POA: Diagnosis not present

## 2022-11-24 DIAGNOSIS — D708 Other neutropenia: Secondary | ICD-10-CM

## 2022-11-25 DIAGNOSIS — M25661 Stiffness of right knee, not elsewhere classified: Secondary | ICD-10-CM | POA: Diagnosis not present

## 2022-11-25 DIAGNOSIS — M25561 Pain in right knee: Secondary | ICD-10-CM | POA: Diagnosis not present

## 2022-11-25 DIAGNOSIS — M62561 Muscle wasting and atrophy, not elsewhere classified, right lower leg: Secondary | ICD-10-CM | POA: Diagnosis not present

## 2022-11-25 DIAGNOSIS — R2689 Other abnormalities of gait and mobility: Secondary | ICD-10-CM | POA: Diagnosis not present

## 2022-11-27 DIAGNOSIS — M25661 Stiffness of right knee, not elsewhere classified: Secondary | ICD-10-CM | POA: Diagnosis not present

## 2022-11-27 DIAGNOSIS — M62561 Muscle wasting and atrophy, not elsewhere classified, right lower leg: Secondary | ICD-10-CM | POA: Diagnosis not present

## 2022-11-27 DIAGNOSIS — M25561 Pain in right knee: Secondary | ICD-10-CM | POA: Diagnosis not present

## 2022-11-27 DIAGNOSIS — R2689 Other abnormalities of gait and mobility: Secondary | ICD-10-CM | POA: Diagnosis not present

## 2022-12-01 DIAGNOSIS — M25661 Stiffness of right knee, not elsewhere classified: Secondary | ICD-10-CM | POA: Diagnosis not present

## 2022-12-01 DIAGNOSIS — M62561 Muscle wasting and atrophy, not elsewhere classified, right lower leg: Secondary | ICD-10-CM | POA: Diagnosis not present

## 2022-12-01 DIAGNOSIS — M25561 Pain in right knee: Secondary | ICD-10-CM | POA: Diagnosis not present

## 2022-12-01 DIAGNOSIS — R2689 Other abnormalities of gait and mobility: Secondary | ICD-10-CM | POA: Diagnosis not present

## 2022-12-08 DIAGNOSIS — M25661 Stiffness of right knee, not elsewhere classified: Secondary | ICD-10-CM | POA: Diagnosis not present

## 2022-12-08 DIAGNOSIS — R2689 Other abnormalities of gait and mobility: Secondary | ICD-10-CM | POA: Diagnosis not present

## 2022-12-08 DIAGNOSIS — M25561 Pain in right knee: Secondary | ICD-10-CM | POA: Diagnosis not present

## 2022-12-08 DIAGNOSIS — M62561 Muscle wasting and atrophy, not elsewhere classified, right lower leg: Secondary | ICD-10-CM | POA: Diagnosis not present

## 2022-12-10 DIAGNOSIS — M62561 Muscle wasting and atrophy, not elsewhere classified, right lower leg: Secondary | ICD-10-CM | POA: Diagnosis not present

## 2022-12-10 DIAGNOSIS — M25561 Pain in right knee: Secondary | ICD-10-CM | POA: Diagnosis not present

## 2022-12-10 DIAGNOSIS — R2689 Other abnormalities of gait and mobility: Secondary | ICD-10-CM | POA: Diagnosis not present

## 2022-12-10 DIAGNOSIS — M25661 Stiffness of right knee, not elsewhere classified: Secondary | ICD-10-CM | POA: Diagnosis not present

## 2022-12-15 DIAGNOSIS — M25661 Stiffness of right knee, not elsewhere classified: Secondary | ICD-10-CM | POA: Diagnosis not present

## 2022-12-15 DIAGNOSIS — M25561 Pain in right knee: Secondary | ICD-10-CM | POA: Diagnosis not present

## 2022-12-15 DIAGNOSIS — R2689 Other abnormalities of gait and mobility: Secondary | ICD-10-CM | POA: Diagnosis not present

## 2022-12-15 DIAGNOSIS — M62561 Muscle wasting and atrophy, not elsewhere classified, right lower leg: Secondary | ICD-10-CM | POA: Diagnosis not present

## 2022-12-18 DIAGNOSIS — M25661 Stiffness of right knee, not elsewhere classified: Secondary | ICD-10-CM | POA: Diagnosis not present

## 2022-12-18 DIAGNOSIS — R2689 Other abnormalities of gait and mobility: Secondary | ICD-10-CM | POA: Diagnosis not present

## 2022-12-18 DIAGNOSIS — M25561 Pain in right knee: Secondary | ICD-10-CM | POA: Diagnosis not present

## 2022-12-18 DIAGNOSIS — M62561 Muscle wasting and atrophy, not elsewhere classified, right lower leg: Secondary | ICD-10-CM | POA: Diagnosis not present

## 2022-12-30 DIAGNOSIS — M25561 Pain in right knee: Secondary | ICD-10-CM | POA: Diagnosis not present

## 2022-12-30 DIAGNOSIS — M62561 Muscle wasting and atrophy, not elsewhere classified, right lower leg: Secondary | ICD-10-CM | POA: Diagnosis not present

## 2022-12-30 DIAGNOSIS — M25661 Stiffness of right knee, not elsewhere classified: Secondary | ICD-10-CM | POA: Diagnosis not present

## 2022-12-30 DIAGNOSIS — R2689 Other abnormalities of gait and mobility: Secondary | ICD-10-CM | POA: Diagnosis not present

## 2023-01-01 DIAGNOSIS — R2689 Other abnormalities of gait and mobility: Secondary | ICD-10-CM | POA: Diagnosis not present

## 2023-01-01 DIAGNOSIS — M62561 Muscle wasting and atrophy, not elsewhere classified, right lower leg: Secondary | ICD-10-CM | POA: Diagnosis not present

## 2023-01-01 DIAGNOSIS — M25561 Pain in right knee: Secondary | ICD-10-CM | POA: Diagnosis not present

## 2023-01-01 DIAGNOSIS — M25661 Stiffness of right knee, not elsewhere classified: Secondary | ICD-10-CM | POA: Diagnosis not present

## 2023-01-04 DIAGNOSIS — H52223 Regular astigmatism, bilateral: Secondary | ICD-10-CM | POA: Diagnosis not present

## 2023-01-08 DIAGNOSIS — H524 Presbyopia: Secondary | ICD-10-CM | POA: Diagnosis not present

## 2023-01-11 DIAGNOSIS — Z23 Encounter for immunization: Secondary | ICD-10-CM | POA: Diagnosis not present

## 2023-01-27 DIAGNOSIS — H0279 Other degenerative disorders of eyelid and periocular area: Secondary | ICD-10-CM | POA: Diagnosis not present

## 2023-01-27 DIAGNOSIS — H02411 Mechanical ptosis of right eyelid: Secondary | ICD-10-CM | POA: Diagnosis not present

## 2023-01-27 DIAGNOSIS — H02423 Myogenic ptosis of bilateral eyelids: Secondary | ICD-10-CM | POA: Diagnosis not present

## 2023-01-27 DIAGNOSIS — H02413 Mechanical ptosis of bilateral eyelids: Secondary | ICD-10-CM | POA: Diagnosis not present

## 2023-02-03 DIAGNOSIS — W109XXA Fall (on) (from) unspecified stairs and steps, initial encounter: Secondary | ICD-10-CM | POA: Diagnosis not present

## 2023-02-03 DIAGNOSIS — W19XXXA Unspecified fall, initial encounter: Secondary | ICD-10-CM | POA: Diagnosis not present

## 2023-02-03 DIAGNOSIS — S0990XA Unspecified injury of head, initial encounter: Secondary | ICD-10-CM | POA: Diagnosis not present

## 2023-02-03 DIAGNOSIS — M542 Cervicalgia: Secondary | ICD-10-CM | POA: Diagnosis not present

## 2023-02-03 DIAGNOSIS — R519 Headache, unspecified: Secondary | ICD-10-CM | POA: Diagnosis not present

## 2023-02-03 DIAGNOSIS — W2201XA Walked into wall, initial encounter: Secondary | ICD-10-CM | POA: Diagnosis not present

## 2023-02-03 DIAGNOSIS — Y92480 Sidewalk as the place of occurrence of the external cause: Secondary | ICD-10-CM | POA: Diagnosis not present

## 2023-02-09 DIAGNOSIS — M25561 Pain in right knee: Secondary | ICD-10-CM | POA: Diagnosis not present

## 2023-02-10 ENCOUNTER — Telehealth: Payer: Self-pay | Admitting: Neurology

## 2023-02-10 NOTE — Telephone Encounter (Signed)
Pt states last Wednesday she tripped and fell, bumping her head. She went to the ER where they did a CT Scan and no bleeding was found but she has been having headaches since then and is concerned. Requesting call back

## 2023-02-10 NOTE — Telephone Encounter (Signed)
I called pt,  she said that she had fall last Wednesday and hit her R frontal head on building.  Was seen in ED and had CT scan, started having headaches. She had no LOC, No N/V, CT no bleed she does take ASA 81mg  po daily.  Neck and chest sore.  I relayed that if she has  LOC changes, n/v, severe headaches to see ED.  I relayed since new issue, see pcp, if feels need for [place] to see will be glad to see.  She verbalized understanding.  Appreciated call back.

## 2023-02-12 DIAGNOSIS — N1831 Chronic kidney disease, stage 3a: Secondary | ICD-10-CM | POA: Diagnosis not present

## 2023-02-12 DIAGNOSIS — I10 Essential (primary) hypertension: Secondary | ICD-10-CM | POA: Diagnosis not present

## 2023-02-12 DIAGNOSIS — G44219 Episodic tension-type headache, not intractable: Secondary | ICD-10-CM | POA: Diagnosis not present

## 2023-02-12 DIAGNOSIS — R209 Unspecified disturbances of skin sensation: Secondary | ICD-10-CM | POA: Diagnosis not present

## 2023-02-15 ENCOUNTER — Other Ambulatory Visit (HOSPITAL_COMMUNITY): Payer: Self-pay | Admitting: Family Medicine

## 2023-02-15 ENCOUNTER — Ambulatory Visit (HOSPITAL_COMMUNITY)
Admission: RE | Admit: 2023-02-15 | Discharge: 2023-02-15 | Disposition: A | Discharge status: Home / Self Care | Payer: Medicare Other | Source: Ambulatory Visit | Attending: Surgery | Admitting: Surgery

## 2023-02-15 DIAGNOSIS — R209 Unspecified disturbances of skin sensation: Secondary | ICD-10-CM | POA: Insufficient documentation

## 2023-02-15 LAB — VAS US ABI WITH/WO TBI
Left ABI: 1.11
Right ABI: 1.05

## 2023-03-08 ENCOUNTER — Other Ambulatory Visit (HOSPITAL_COMMUNITY): Payer: Self-pay | Admitting: Obstetrics and Gynecology

## 2023-03-08 DIAGNOSIS — Z1231 Encounter for screening mammogram for malignant neoplasm of breast: Secondary | ICD-10-CM

## 2023-03-16 DIAGNOSIS — M1711 Unilateral primary osteoarthritis, right knee: Secondary | ICD-10-CM | POA: Diagnosis not present

## 2023-03-16 DIAGNOSIS — M1712 Unilateral primary osteoarthritis, left knee: Secondary | ICD-10-CM | POA: Diagnosis not present

## 2023-04-02 ENCOUNTER — Ambulatory Visit (HOSPITAL_COMMUNITY)
Admission: RE | Admit: 2023-04-02 | Discharge: 2023-04-02 | Disposition: A | Discharge status: Home / Self Care | Payer: Medicare Other | Source: Ambulatory Visit | Attending: Obstetrics and Gynecology | Admitting: Obstetrics and Gynecology

## 2023-04-02 DIAGNOSIS — Z1231 Encounter for screening mammogram for malignant neoplasm of breast: Secondary | ICD-10-CM | POA: Diagnosis not present

## 2023-04-08 ENCOUNTER — Ambulatory Visit
Admission: EM | Admit: 2023-04-08 | Discharge: 2023-04-08 | Disposition: A | Discharge status: Home / Self Care | Payer: Medicare Other | Attending: Nurse Practitioner | Admitting: Nurse Practitioner

## 2023-04-08 DIAGNOSIS — J069 Acute upper respiratory infection, unspecified: Secondary | ICD-10-CM

## 2023-04-08 MED ORDER — BENZONATATE 100 MG PO CAPS: 100.0000 mg | ORAL_CAPSULE | Freq: Three times a day (TID) | ORAL | 0 refills | Status: DC | PRN

## 2023-04-08 NOTE — ED Provider Notes (Signed)
[facility]    CSN: [id number] Arrival date & time: 04/08/23  1617      History   Chief Complaint Chief Complaint  Patient presents with   Cough    HPI Wendy [name] is a 83 y.o. female.   Patient presents today with 4-5 day history of occasional congested cough, frequent dry cough worse with talking, chest and back pain when she coughs, sore throat, fatigue, and ear pain that began today.  No fever, body aches/chills, shortness of breath, chest tightness, runny/stuffy nose, headache, abdominal pain, nausea/vomiting, or diarrhea.  No change in appetite.  Has been taking OTC Coridicin and robitussin DM for symptoms with some improvement.    Past Medical History:  Diagnosis Date   Arthritis    hands, neck lower back   Cataract    extracted   GERD (gastroesophageal reflux disease)    prior med   Hypertension    Lumbar stenosis     Patient Active Problem List   Diagnosis Date Noted   Bilateral carpal tunnel syndrome 08/20/2022   Lumbosacral radiculopathy 08/20/2022   Spinal stenosis of lumbar region with radiculopathy 04/27/2022   Dense breast tissue on mammogram 05/04/2017   H/O mitral valve prolapse 02/18/2017   Essential hypertension 01/18/2017   Chronic GERD 01/18/2017   History of Helicobacter pylori infection 01/18/2017   Mild hyperlipidemia 01/18/2017   Leucopenia 01/18/2017   Environmental allergies 01/18/2017    Past Surgical History:  Procedure Laterality Date   CATARACT EXTRACTION, BILATERAL     LESION REMOVAL Right 10/21/2022   Procedure: EXCISION SKIN LESION RIGHT CALF;  Surgeon: Griselda Miner, MD;  Location: [facility];  Service: General;  Laterality: Right;  46   SHOULDER ARTHROSCOPY Right    SHOULDER ARTHROTOMY Left    about 2007    OB History   No obstetric history on file.      Home Medications    Prior to Admission medications   Medication Sig Start Date End Date Taking? Authorizing Provider  benzonatate  (TESSALON) 100 MG capsule Take 1 capsule (100 mg total) by mouth 3 (three) times daily as needed for cough. Do not take with alcohol or while operating or driving heavy machinery 9/62/95  Yes Valentino Nose, NP  Ascorbic Acid (VITAMIN C) 500 MG CAPS 2 capsules    [provider]  aspirin EC 81 MG tablet Take 81 mg by mouth daily.    [provider]  cholecalciferol (VITAMIN D3) 25 MCG (1000 UNIT) tablet Take 1,000 Units by mouth daily.    [provider]  Glucosamine 500 MG CAPS in the morning and at bedtime.    [provider]  losartan-hydrochlorothiazide (HYZAAR) 50-12.5 MG tablet Take 1 tablet by mouth daily. 07/19/18   [provider]  Multiple Vitamin (MULTIVITAMIN) tablet Take 1 tablet by mouth daily.    [provider]  Omega-3 Fatty Acids (FISH OIL OMEGA-3 PO) Take 1 capsule by mouth.    [provider]  oxyCODONE (ROXICODONE) 5 MG immediate release tablet Take 1 tablet (5 mg total) by mouth every 6 (six) hours as needed for severe pain. 10/21/22   Griselda Miner, MD    Family History Family History  Problem Relation Age of Onset   COPD Mother    Arthritis Mother    Hearing loss Mother    Hypertension Mother    Stroke Father 44   Diabetes Father    Heart disease Sister  Hyperlipidemia Sister    Hypertension Sister    Heart attack Sister    Cancer Sister        Leukemia   Cancer Brother        prostate   Mental illness Brother        PTSD from war   Thyroid disease Daughter    Diabetes Son    Cancer Maternal Aunt        breast    Social History Social History   Tobacco Use   Smoking status: Never   Smokeless tobacco: Never  Vaping Use   Vaping status: Never Used  Substance Use Topics   Alcohol use: No   Drug use: No     Allergies   Patient has no known allergies.   Review of Systems Review of Systems Per HPI  Physical Exam Triage Vital Signs ED Triage Vitals  Encounter Vitals Group      BP 04/08/23 1622 (!) 154/78     Systolic BP Percentile --      Diastolic BP Percentile --      Pulse Rate 04/08/23 1622 67     Resp 04/08/23 1622 18     Temp 04/08/23 1622 97.9 F (36.6 C)     Temp Source 04/08/23 1622 Oral     SpO2 04/08/23 1622 96 %     Weight --      Height --      Head Circumference --      Peak Flow --      Pain Score 04/08/23 1624 5     Pain Loc --      Pain Education --      Exclude from Growth Chart --    No data found.  Updated Vital Signs BP (!) 154/78 (BP Location: Right Arm)   Pulse 67   Temp 97.9 F (36.6 C) (Oral)   Resp 18   SpO2 96%   Visual Acuity Right Eye Distance:   Left Eye Distance:   Bilateral Distance:    Right Eye Near:   Left Eye Near:    Bilateral Near:     Physical Exam Vitals and nursing note reviewed.  Constitutional:      General: She is not in acute distress.    Appearance: Normal appearance. She is not ill-appearing or toxic-appearing.  HENT:     Head: Normocephalic and atraumatic.     Right Ear: Tympanic membrane, ear canal and external ear normal.     Left Ear: Tympanic membrane, ear canal and external ear normal.     Nose: Congestion present. No rhinorrhea.     Mouth/Throat:     Mouth: Mucous membranes are moist.     Pharynx: Oropharynx is clear. No oropharyngeal exudate or posterior oropharyngeal erythema.  Eyes:     General: No scleral icterus.    Extraocular Movements: Extraocular movements intact.  Cardiovascular:     Rate and Rhythm: Normal rate and regular rhythm.  Pulmonary:     Effort: Pulmonary effort is normal. No respiratory distress.     Breath sounds: Normal breath sounds. No wheezing, rhonchi or rales.  Musculoskeletal:     Cervical back: Normal range of motion and neck supple.  Lymphadenopathy:     Cervical: No cervical adenopathy.  Skin:    General: Skin is warm and dry.     Coloration: Skin is not jaundiced or pale.     Findings: No erythema or rash.  Neurological:     Mental  Status: She is alert and oriented to person, place, and time.  Psychiatric:        Behavior: Behavior is cooperative.      UC Treatments / Results  Labs (all labs ordered are listed, but only abnormal results are displayed) Labs Reviewed - No data to display  EKG   Radiology No results found.  Procedures Procedures (including critical care time)  Medications Ordered in UC Medications - No data to display  Initial Impression / Assessment and Plan / UC Course  I have reviewed the triage vital signs and the nursing notes.  Pertinent labs & imaging results that were available during my care of the patient were reviewed by me and considered in my medical decision making (see chart for details).   Patient is well-appearing, normotensive, afebrile, not tachycardic, not tachypneic, oxygenating well on room air.    1. Viral URI with cough Vitals and examination are reassuring today Supportive care discussed; start cough suppressant medication Patient declines COVID-19 testing which is reasonable given length of symptoms ER and return precautions discussed   The patient was given the opportunity to ask questions.  All questions answered to their satisfaction.  The patient is in agreement to this plan.   Final Clinical Impressions(s) / UC Diagnoses   Final diagnoses:  Viral URI with cough     Discharge Instructions      You have a viral upper respiratory infection.  Symptoms should improve over the next week to 10 days.  If you develop chest pain or shortness of breath, go to the emergency room.  Some things that can make you feel better are: - Increased rest - Increasing fluid with water/sugar free electrolytes - Acetaminophen and ibuprofen as needed for fever/pain - Salt water gargling, chloraseptic spray and throat lozenges - OTC guaifenesin (Mucinex) 600 mg twice daily for congestion - Saline sinus flushes or a neti pot - Humidifying the air -Tessalon Perles every 8  hours as needed for dry cough      ED Prescriptions     Medication Sig Dispense Auth. Provider   benzonatate (TESSALON) 100 MG capsule Take 1 capsule (100 mg total) by mouth 3 (three) times daily as needed for cough. Do not take with alcohol or while operating or driving heavy machinery 21 capsule Valentino Nose, NP      PDMP not reviewed this encounter.   Valentino Nose, NP 04/08/23 [phone]

## 2023-04-08 NOTE — Discharge Instructions (Signed)
You have a viral upper respiratory infection.  Symptoms should improve over the next week to 10 days.  If you develop chest pain or shortness of breath, go to the emergency room.  Some things that can make you feel better are: - Increased rest - Increasing fluid with water/sugar free electrolytes - Acetaminophen and ibuprofen as needed for fever/pain - Salt water gargling, chloraseptic spray and throat lozenges - OTC guaifenesin (Mucinex) 600 mg twice daily for congestion - Saline sinus flushes or a neti pot - Humidifying the air -Tessalon Perles every 8 hours as needed for dry cough

## 2023-04-08 NOTE — ED Triage Notes (Signed)
Pt reports she has a cough, nasal congestion, left ear pain x 3 days

## 2023-04-14 DIAGNOSIS — J019 Acute sinusitis, unspecified: Secondary | ICD-10-CM | POA: Diagnosis not present

## 2023-04-29 DIAGNOSIS — J209 Acute bronchitis, unspecified: Secondary | ICD-10-CM | POA: Diagnosis not present

## 2023-06-03 DIAGNOSIS — Z6830 Body mass index (BMI) 30.0-30.9, adult: Secondary | ICD-10-CM | POA: Diagnosis not present

## 2023-06-03 DIAGNOSIS — J209 Acute bronchitis, unspecified: Secondary | ICD-10-CM | POA: Diagnosis not present

## 2023-06-21 ENCOUNTER — Other Ambulatory Visit (HOSPITAL_COMMUNITY): Payer: Self-pay | Admitting: Family Medicine

## 2023-06-21 DIAGNOSIS — Z0000 Encounter for general adult medical examination without abnormal findings: Secondary | ICD-10-CM | POA: Diagnosis not present

## 2023-06-21 DIAGNOSIS — N1831 Chronic kidney disease, stage 3a: Secondary | ICD-10-CM | POA: Diagnosis not present

## 2023-06-21 DIAGNOSIS — I10 Essential (primary) hypertension: Secondary | ICD-10-CM | POA: Diagnosis not present

## 2023-06-21 DIAGNOSIS — M8589 Other specified disorders of bone density and structure, multiple sites: Secondary | ICD-10-CM

## 2023-06-21 DIAGNOSIS — Z1321 Encounter for screening for nutritional disorder: Secondary | ICD-10-CM | POA: Diagnosis not present

## 2023-06-21 DIAGNOSIS — Z01419 Encounter for gynecological examination (general) (routine) without abnormal findings: Secondary | ICD-10-CM | POA: Diagnosis not present

## 2023-06-21 DIAGNOSIS — D72819 Decreased white blood cell count, unspecified: Secondary | ICD-10-CM | POA: Diagnosis not present

## 2023-06-23 ENCOUNTER — Telehealth: Payer: Self-pay

## 2023-06-23 NOTE — Telephone Encounter (Signed)
   Pre-operative Risk Assessment    Patient Name: Wendy [name]  DOB: 1940/10/22 MRN: [id number]   Date of last office visit: 10/22/22 Olinda Bertrand, DO Date of next office visit: NONE   Request for Surgical Clearance    Procedure:   BILATERAL UPPER EYELID BLEPHAROPTOSIS REPAIR  Date of Surgery:  Clearance 07/05/23                                Surgeon:  DR Peggie Bowen Surgeon's Group or Practice Name:  [facility] Phone number:  [phone] Fax number:  [phone]   Type of Clearance Requested:   - Medical  - Pharmacy:  Hold Aspirin     Type of Anesthesia:  Not Indicated   Additional requests/questions:    SignedCollin Deal   06/23/2023, 4:44 PM

## 2023-06-24 ENCOUNTER — Telehealth: Payer: Self-pay

## 2023-06-24 NOTE — Telephone Encounter (Signed)
 Spoke with patient who is agreeable to do a tele visit on 5/19 at 9:20 AM. Med rec and consent have been done.  Patient made me aware that her surgery has change her date to June 9th.

## 2023-06-24 NOTE — Telephone Encounter (Signed)
  Patient Consent for Virtual Visit        Wendy [name] has provided verbal consent on 06/24/2023 for a virtual visit (video or telephone).   CONSENT FOR VIRTUAL VISIT FOR:  Wendy [name]  By participating in this virtual visit I agree to the following:  I hereby voluntarily request, consent and authorize [facility] and its employed or contracted physicians, physician assistants, nurse practitioners or other licensed health care professionals (the Practitioner), to provide me with telemedicine health care services (the "Services") as deemed necessary by the treating Practitioner. I acknowledge and consent to receive the Services by the Practitioner via telemedicine. I understand that the telemedicine visit will involve communicating with the Practitioner through live audiovisual communication technology and the disclosure of certain medical information by electronic transmission. I acknowledge that I have been given the opportunity to request an in-person assessment or other available alternative prior to the telemedicine visit and am voluntarily participating in the telemedicine visit.  I understand that I have the right to withhold or withdraw my consent to the use of telemedicine in the course of my care at any time, without affecting my right to future care or treatment, and that the Practitioner or I may terminate the telemedicine visit at any time. I understand that I have the right to inspect all information obtained and/or recorded in the course of the telemedicine visit and may receive copies of available information for a reasonable fee.  I understand that some of the potential risks of receiving the Services via telemedicine include:  Delay or interruption in medical evaluation due to technological equipment failure or disruption; Information transmitted may not be sufficient (e.g. poor resolution of images) to allow for appropriate medical decision making by the Practitioner; and/or   In rare instances, security protocols could fail, causing a breach of personal health information.  Furthermore, I acknowledge that it is my responsibility to provide information about my medical history, conditions and care that is complete and accurate to the best of my ability. I acknowledge that Practitioner's advice, recommendations, and/or decision may be based on factors not within their control, such as incomplete or inaccurate data provided by me or distortions of diagnostic images or specimens that may result from electronic transmissions. I understand that the practice of medicine is not an exact science and that Practitioner makes no warranties or guarantees regarding treatment outcomes. I acknowledge that a copy of this consent can be made available to me via my patient portal [facility] MyChart), or I can request a printed copy by calling the office of [facility].    I understand that my insurance will be billed for this visit.   I have read or had this consent read to me. I understand the contents of this consent, which adequately explains the benefits and risks of the Services being provided via telemedicine.  I have been provided ample opportunity to ask questions regarding this consent and the Services and have had my questions answered to my satisfaction. I give my informed consent for the services to be provided through the use of telemedicine in my medical care

## 2023-06-24 NOTE — Telephone Encounter (Signed)
   Name: Wendy [name]  DOB: November 23, 1940  MRN: [id number]  Primary Cardiologist: None Last OV: 10/22/22 with Dr. Albert Huff  Preoperative team, please contact this patient and set up a phone call appointment for further preoperative risk assessment. Please obtain consent and complete medication review. Thank you for your help.  I confirm that guidance regarding antiplatelet and oral anticoagulation therapy has been completed and, if necessary, noted below.  Regarding ASA therapy, we recommend continuation of ASA throughout the perioperative period.  However, if the surgeon feels that cessation of ASA is required in the perioperative period, it may be stopped 5-7 days prior to surgery with a plan to resume it as soon as felt to be feasible from a surgical standpoint in the post-operative period.   I also confirmed the patient resides in the state of [place] . As per [facility] Medical Board telemedicine laws, the patient must reside in the state in which the provider is licensed.  Fabian Walder D Toshi Ishii, NP 06/24/2023, 9:32 AM [facility]

## 2023-06-25 DIAGNOSIS — T148XXA Other injury of unspecified body region, initial encounter: Secondary | ICD-10-CM | POA: Diagnosis not present

## 2023-06-29 DIAGNOSIS — M5416 Radiculopathy, lumbar region: Secondary | ICD-10-CM | POA: Diagnosis not present

## 2023-06-29 DIAGNOSIS — M47816 Spondylosis without myelopathy or radiculopathy, lumbar region: Secondary | ICD-10-CM | POA: Diagnosis not present

## 2023-07-02 ENCOUNTER — Ambulatory Visit (HOSPITAL_COMMUNITY)
Admission: RE | Admit: 2023-07-02 | Discharge: 2023-07-02 | Disposition: A | Discharge status: Home / Self Care | Source: Ambulatory Visit | Attending: Family Medicine | Admitting: Family Medicine

## 2023-07-02 DIAGNOSIS — M8589 Other specified disorders of bone density and structure, multiple sites: Secondary | ICD-10-CM | POA: Insufficient documentation

## 2023-07-02 DIAGNOSIS — M85852 Other specified disorders of bone density and structure, left thigh: Secondary | ICD-10-CM | POA: Diagnosis not present

## 2023-07-02 DIAGNOSIS — Z780 Asymptomatic menopausal state: Secondary | ICD-10-CM | POA: Diagnosis not present

## 2023-07-12 ENCOUNTER — Ambulatory Visit: Attending: Cardiovascular Disease | Admitting: Emergency Medicine

## 2023-07-12 DIAGNOSIS — Z01810 Encounter for preprocedural cardiovascular examination: Secondary | ICD-10-CM

## 2023-07-12 NOTE — Progress Notes (Signed)
 Virtual Visit via Telephone Note   Because of Wendy [name] co-morbid illnesses, she is at least at moderate risk for complications without adequate follow up.  This format is felt to be most appropriate for this patient at this time.  Due to technical limitations with video connection (technology), today's appointment will be conducted as an audio only telehealth visit, and Wendy [name] verbally agreed to proceed in this manner.   All issues noted in this document were discussed and addressed.  No physical exam could be performed with this format.  Evaluation Performed:  Preoperative cardiovascular risk assessment _____________   Date:  07/12/2023   Patient ID:  Wendy [name], DOB Dec 21, 1940, MRN [id number] Patient Location:  Home Provider location:   Office  Primary Care Provider:  Nanda Baar, DO (Inactive) Primary Cardiologist:  None  Chief Complaint / Patient Profile   83 y.o. y/o female with a h/o sinus bradycardia, coronary artery disease, hypertension who is pending bilateral upper eyelid blepharoptosis repair on 08/02/2023 with [facility] by Dr. Zaldivar And presents today for telephonic preoperative cardiovascular risk assessment.  History of Present Illness    Wendy [name] is a 83 y.o. female who presents via audio/video conferencing for a telehealth visit today.  Pt was last seen in cardiology clinic on 10/22/2022 by Dr. Albert Huff.  At that time Wendy [name] was doing well.  The patient is now pending procedure as outlined above. Since her last visit, she denies chest pain, shortness of breath, lower extremity edema, fatigue, palpitations, melena, hematuria, hemoptysis, diaphoresis, weakness, presyncope, syncope, orthopnea, and PND.  Today patient is doing well overall.  She is without any acute cardiovascular concerns or complaints.  She stays relatively active.  Some of her activity is limited by bilateral osteoarthritis in her knees.  However she is still able to do household chores, walk  upstairs, perform yard work.  She is without any exertional angina.  She is able to complete greater than 4 METS.  Past Medical History    Past Medical History:  Diagnosis Date   Arthritis    hands, neck lower back   Cataract    extracted   GERD (gastroesophageal reflux disease)    prior med   Hypertension    Lumbar stenosis    Past Surgical History:  Procedure Laterality Date   CATARACT EXTRACTION, BILATERAL     LESION REMOVAL Right 10/21/2022   Procedure: EXCISION SKIN LESION RIGHT CALF;  Surgeon: Caralyn Chandler, MD;  Location: [facility];  Service: General;  Laterality: Right;  45   SHOULDER ARTHROSCOPY Right    SHOULDER ARTHROTOMY Left    about 2007    Allergies  No Known Allergies  Home Medications    Prior to Admission medications   Medication Sig Start Date End Date Taking? Authorizing Provider  Ascorbic Acid (VITAMIN C) 500 MG CAPS 2 capsules    [provider]  aspirin EC 81 MG tablet Take 81 mg by mouth daily.    [provider]  benzonatate  (TESSALON ) 100 MG capsule Take 1 capsule (100 mg total) by mouth 3 (three) times daily as needed for cough. Do not take with alcohol or while operating or driving heavy machinery 08/22/50   Wilhemena Harbour, NP  cholecalciferol (VITAMIN D3) 25 MCG (1000 UNIT) tablet Take 1,000 Units by mouth daily.    [provider]  Glucosamine 500 MG CAPS in the morning and at bedtime.    [provider]  losartan-hydrochlorothiazide (HYZAAR) 50-12.5 MG  tablet Take 1 tablet by mouth daily. 07/19/18   [provider]  Multiple Vitamin (MULTIVITAMIN) tablet Take 1 tablet by mouth daily.    [provider]  Omega-3 Fatty Acids (FISH OIL OMEGA-3 PO) Take 1 capsule by mouth.    [provider]  oxyCODONE  (ROXICODONE ) 5 MG immediate release tablet Take 1 tablet (5 mg total) by mouth every 6 (six) hours as needed for severe pain. 10/21/22   Caralyn Chandler, MD    Physical  Exam    Vital Signs:  Wendy [name] does not have vital signs available for review today.  Given telephonic nature of communication, physical exam is limited. AAOx3. NAD. Normal affect.  Speech and respirations are unlabored.  Accessory Clinical Findings    None  Assessment & Plan    1.  Preoperative Cardiovascular Risk Assessment: According to the Revised Cardiac Risk Index (RCRI), her Perioperative Risk of Major Cardiac Event is (%): 0.4. Her Functional Capacity in METs is: 5.62 according to the Duke Activity Status Index (DASI). Therefore, based on ACC/AHA guidelines, patient would be at acceptable risk for the planned procedure without further cardiovascular testing.   The patient was advised that if she develops new symptoms prior to surgery to contact our office to arrange for a follow-up visit, and she verbalized understanding.  Regarding ASA therapy, we recommend continuation of ASA throughout the perioperative period. However, if the surgeon feels that cessation of ASA is required in the perioperative period, it may be stopped 5-7 days prior to surgery with a plan to resume it as soon as felt to be feasible from a surgical standpoint in the post-operative period.   A copy of this note will be routed to requesting surgeon.  Time:   Today, I have spent 7 minutes with the patient with telehealth technology discussing medical history, symptoms, and management plan.     Ava Boatman, NP  07/12/2023, 9:28 AM

## 2023-07-13 DIAGNOSIS — M5416 Radiculopathy, lumbar region: Secondary | ICD-10-CM | POA: Diagnosis not present

## 2023-07-13 DIAGNOSIS — M25561 Pain in right knee: Secondary | ICD-10-CM | POA: Diagnosis not present

## 2023-07-23 ENCOUNTER — Encounter (INDEPENDENT_AMBULATORY_CARE_PROVIDER_SITE_OTHER): Payer: Self-pay | Admitting: Otolaryngology

## 2023-07-23 ENCOUNTER — Ambulatory Visit (INDEPENDENT_AMBULATORY_CARE_PROVIDER_SITE_OTHER): Payer: Medicare Other | Admitting: Otolaryngology

## 2023-07-23 VITALS — BP 153/62 | HR 72 | Ht 59.0 in | Wt 143.0 lb

## 2023-07-23 DIAGNOSIS — H6121 Impacted cerumen, right ear: Secondary | ICD-10-CM | POA: Diagnosis not present

## 2023-07-23 DIAGNOSIS — H903 Sensorineural hearing loss, bilateral: Secondary | ICD-10-CM

## 2023-07-23 DIAGNOSIS — H6982 Other specified disorders of Eustachian tube, left ear: Secondary | ICD-10-CM | POA: Diagnosis not present

## 2023-07-23 NOTE — Progress Notes (Signed)
 Patient ID: Wendy [name], female   DOB: 1940-04-29, 83 y.o.   MRN: [id number]  Follow-up: Hearing loss, left ear eustachian tube dysfunction  HPI: The patient is an 83 year old female who returns today for her follow-up evaluation. The patient has a history of bilateral high-frequency hearing loss, left eustachian tube dysfunction, and left middle ear effusion. She underwent left myringotomy and tube placement in December 2021.  The tube has since extruded.  According to the patient, she has been doing well.  She has not noted any recurrent middle ear effusion.  Currently she wears bilateral hearing aids.  She denies any otalgia, otorrhea, recent change in her hearing, or vertigo.  Exam: General: Communicates without difficulty, well nourished, no acute distress. Head: Normocephalic, no evidence injury, no tenderness, facial buttresses intact without stepoff. Face/sinus: No tenderness to palpation and percussion. Facial movement is normal and symmetric. Eyes: PERRL, EOMI. No scleral icterus, conjunctivae clear. Neuro: CN II exam reveals vision grossly intact.  No nystagmus at any point of gaze. Ears: Auricles well formed without lesions.  Right ear cerumen impaction.  The left ear canal and tympanic membrane are normal.  Nose: External evaluation reveals normal support and skin without lesions.  Dorsum is intact.  Anterior rhinoscopy reveals congested mucosa over anterior aspect of inferior turbinates and intact septum.  No purulence noted. Oral:  Oral cavity and oropharynx are intact, symmetric, without erythema or edema.  Mucosa is moist without lesions. Neck: Full range of motion without pain.  There is no significant lymphadenopathy.  No masses palpable.  Thyroid  bed within normal limits to palpation.  Parotid glands and submandibular glands equal bilaterally without mass.  Trachea is midline. Neuro:  CN 2-12 grossly intact.   Procedure: Right ear cerumen disimpaction Anesthesia: None Description: Under  the operating microscope, the cerumen is carefully removed with a combination of cerumen currette, alligator forceps, and suction catheters.  After the cerumen is removed, the TMs are noted to be normal.  No mass, erythema, or lesions. The patient tolerated the procedure well.   Assessment: 1.  Right ear cerumen impaction.  After the cerumen disimpaction procedure, both tympanic membranes and middle ear spaces are noted to be normal. 2.  Subjectively stable bilateral high-frequency sensorineural hearing loss. 3.  Left ear eustachian tube dysfunction.  No middle ear effusion is noted today.  Plan: 1.  Otomicroscopy with right ear cerumen disimpaction. 2.  The physical exam findings are reviewed with the patient. 3.  Continue the use of her hearing aids. 4.  The patient will return for reevaluation in 1 year.

## 2023-07-25 DIAGNOSIS — H903 Sensorineural hearing loss, bilateral: Secondary | ICD-10-CM | POA: Insufficient documentation

## 2023-07-25 DIAGNOSIS — H6982 Other specified disorders of Eustachian tube, left ear: Secondary | ICD-10-CM | POA: Insufficient documentation

## 2023-07-25 DIAGNOSIS — H6121 Impacted cerumen, right ear: Secondary | ICD-10-CM | POA: Insufficient documentation

## 2023-07-27 DIAGNOSIS — H02423 Myogenic ptosis of bilateral eyelids: Secondary | ICD-10-CM | POA: Diagnosis not present

## 2023-07-27 DIAGNOSIS — I10 Essential (primary) hypertension: Secondary | ICD-10-CM | POA: Diagnosis not present

## 2023-07-27 DIAGNOSIS — H0279 Other degenerative disorders of eyelid and periocular area: Secondary | ICD-10-CM | POA: Diagnosis not present

## 2023-07-27 DIAGNOSIS — N1831 Chronic kidney disease, stage 3a: Secondary | ICD-10-CM | POA: Diagnosis not present

## 2023-07-27 DIAGNOSIS — H02413 Mechanical ptosis of bilateral eyelids: Secondary | ICD-10-CM | POA: Diagnosis not present

## 2023-08-02 DIAGNOSIS — H02413 Mechanical ptosis of bilateral eyelids: Secondary | ICD-10-CM | POA: Diagnosis not present

## 2023-08-02 DIAGNOSIS — H02423 Myogenic ptosis of bilateral eyelids: Secondary | ICD-10-CM | POA: Diagnosis not present

## 2023-08-17 DIAGNOSIS — M7121 Synovial cyst of popliteal space [Baker], right knee: Secondary | ICD-10-CM | POA: Diagnosis not present

## 2023-08-17 DIAGNOSIS — M1711 Unilateral primary osteoarthritis, right knee: Secondary | ICD-10-CM | POA: Diagnosis not present

## 2023-08-18 ENCOUNTER — Telehealth: Payer: Self-pay | Admitting: *Deleted

## 2023-08-18 NOTE — Telephone Encounter (Signed)
   Patient Name: Wendy [name]  DOB: [birth year] MRN: [id number]  Primary Cardiologist: None  Chart reviewed as part of pre-operative protocol coverage. Given past medical history and time since last visit, based on ACC/AHA guidelines, Wendy [name] is at acceptable risk for the planned procedure without further cardiovascular testing.   Her RCRI perioperative risk in 3.9%. She denies any recent chest pain or SOB. She is able to achieve 4 METS of activity without exertional chest pain or worsening dyspnea.   The patient was advised that if she develops new symptoms prior to surgery to contact our office to arrange for a follow-up visit, and she verbalized understanding.  I will route this recommendation to the requesting party via Epic fax function and remove from pre-op pool.  Please call with questions.  Toben Acuna, [place] 08/18/2023, 5:28 PM

## 2023-08-18 NOTE — Telephone Encounter (Signed)
   Pre-operative Risk Assessment    Patient Name: Wendy [name]  DOB: 05/26/1940 MRN: [id number]   Date of last office visit: 07/12/23 PREOP TELE VISIT; 10/22/22 DR. TOLIA Date of next office visit: 10/27/23 DR. TOLIA   Request for Surgical Clearance    Procedure:  RIGHT TOTAL KNEE ARTHROPLASTY  Date of Surgery:  Clearance TBD                                Surgeon:  DR. ELSPETH HER  Surgeon's Group or Practice Name:  [facility] Phone number:  [phone] MEGAN DAVIS Fax number:  [phone]   Type of Clearance Requested:   - Medical  - Pharmacy:  Hold Aspirin     Type of Anesthesia:  CHOICE   Additional requests/questions:    Bonney Niels Jest   08/18/2023, 9:27 AM

## 2023-08-18 NOTE — Telephone Encounter (Signed)
 Patient was cleared for a different procedure last month, at the time, she was able to accomplish more than 4 METS of activity.  I attempted to call the patient today to confirm she is still able to achieve 4 metabolic level activity, she requested a call back later around 4:30 PM today.

## 2023-08-23 DIAGNOSIS — I10 Essential (primary) hypertension: Secondary | ICD-10-CM | POA: Diagnosis not present

## 2023-08-23 DIAGNOSIS — N1831 Chronic kidney disease, stage 3a: Secondary | ICD-10-CM | POA: Diagnosis not present

## 2023-08-25 DIAGNOSIS — R2689 Other abnormalities of gait and mobility: Secondary | ICD-10-CM | POA: Diagnosis not present

## 2023-08-25 DIAGNOSIS — M47816 Spondylosis without myelopathy or radiculopathy, lumbar region: Secondary | ICD-10-CM | POA: Diagnosis not present

## 2023-08-25 DIAGNOSIS — M6281 Muscle weakness (generalized): Secondary | ICD-10-CM | POA: Diagnosis not present

## 2023-08-26 DIAGNOSIS — I10 Essential (primary) hypertension: Secondary | ICD-10-CM | POA: Diagnosis not present

## 2023-08-26 DIAGNOSIS — N1831 Chronic kidney disease, stage 3a: Secondary | ICD-10-CM | POA: Diagnosis not present

## 2023-08-30 ENCOUNTER — Ambulatory Visit (INDEPENDENT_AMBULATORY_CARE_PROVIDER_SITE_OTHER): Admitting: Audiology

## 2023-08-30 ENCOUNTER — Ambulatory Visit (INDEPENDENT_AMBULATORY_CARE_PROVIDER_SITE_OTHER): Payer: Self-pay | Admitting: Audiology

## 2023-08-30 DIAGNOSIS — H903 Sensorineural hearing loss, bilateral: Secondary | ICD-10-CM

## 2023-08-30 NOTE — Progress Notes (Unsigned)
  [address] [place], [place] [address] [phone]  Audiological Evaluation    Name: Wendy [name]     DOB:   03-03-1940      MRN:   [id number]                                                                                     Service Date: 08/30/2023     Accompanied by: husband   Patient comes today after Dr. Karis, ENT sent a referral for a hearing evaluation due to concerns with hearing loss.   Symptoms Yes Details  Hearing loss  [x]  Previous audiograms completed at [facility]  Tinnitus  []    Ear pain/ infections/pressure  []    Balance problems  []    Noise exposure history  []    Previous ear surgeries  [x]  Had a PE tube placed once  Family history of hearing loss  []    Amplification  [x]  Has a set of HA's fit at [facility]  Other  []      Otoscopy: Right ear: Clear external ear canal and notable landmarks visualized on the tympanic membrane. Left ear:  Clear external ear canal and notable landmarks visualized on the tympanic membrane.  Tympanometry: Right ear: Type A- Normal external ear canal volume with normal middle ear pressure and tympanic membrane compliance. Left ear: Type A- Normal external ear canal volume with normal middle ear pressure and tympanic membrane compliance.    Pure tone Audiometry: Right ear- Normal to moderately severe sensorineural hearing loss from 250 Hz - 8000 Hz. Left ear-  Normal to severe sensorineural hearing loss from 250 Hz - 8000 Hz. No significant changes noted when compared to the previous audiogram completed last year at Dr[facility]je[facility].   Speech Audiometry: Right ear- Speech Reception Threshold (SRT) was obtained at 40 dBHL. Left ear-Speech Reception Threshold (SRT) was obtained at 40 dBHL.   Word Recognition Score Tested using NU-6 (recorded) Right ear: 72% was obtained at a presentation level of 85 dBHL with contralateral masking which is deemed as  fair. Left ear: 72% was obtained at a presentation  level of 85 dBHL with contralateral masking which is deemed as  fair.   The hearing test results were completed under headphones and results are deemed to be of good reliability. Test technique:  conventional     Recommendations: Follow up with ENT as scheduled for today. Return for a hearing evaluation if concerns with hearing changes arise or per MD recommendation. Consider a communication needs assessment after medical clearance for hearing aids is obtained.   Veretta Sabourin MARIE LEROUX-MARTINEZ, AUD

## 2023-08-30 NOTE — Progress Notes (Addendum)
  [address] [place], [place] [address] [phone]  Hearing Aid Check     Wendy [name] was added on to the schedule for a hearing aid check.   Accompanied ab:[email or web address]   Right Left  Hearing aid manufacturer Phonak [place] 50R [id number] Phonak [place] 50R [id number]  Hearing aid style Receiver in the canal Receiver in the canal  Hearing aid battery rechargeable rechargeable  Receiver    Dome/ custom earpiece    Retention wire    Warranty expiration date 07-24-2021 07-24-2021  Loss and Damage expired expired  Initial fitting date 05-16-2019  Device was fit at: Dr[facility]    Chief complaint: Patient reports needs aids to be cleaned.  Actions taken: Cleaned both aids. Showed patient how she could clean her aids. Recommended to clean the dome daily with a paper towel.  Services fee: $0 was paid at checkout.Patient said she did not now she had to pay for services. She was a work in and I did not have her hearing aid information at hand.  Patient was oriented about services fees that will apply in the future and that the hearing aid check appointment needs to be scheduled.   Recommend: Return for a hearing aid check , as needed. Return for a hearing evaluation and to see an ENT, if concerns with hearing changes arise.    Sheily Lineman MARIE LEROUX-MARTINEZ, AUD

## 2023-09-01 DIAGNOSIS — R2689 Other abnormalities of gait and mobility: Secondary | ICD-10-CM | POA: Diagnosis not present

## 2023-09-01 DIAGNOSIS — M6281 Muscle weakness (generalized): Secondary | ICD-10-CM | POA: Diagnosis not present

## 2023-09-01 DIAGNOSIS — L820 Inflamed seborrheic keratosis: Secondary | ICD-10-CM | POA: Diagnosis not present

## 2023-09-01 DIAGNOSIS — M47816 Spondylosis without myelopathy or radiculopathy, lumbar region: Secondary | ICD-10-CM | POA: Diagnosis not present

## 2023-09-01 DIAGNOSIS — L821 Other seborrheic keratosis: Secondary | ICD-10-CM | POA: Diagnosis not present

## 2023-09-02 DIAGNOSIS — M7121 Synovial cyst of popliteal space [Baker], right knee: Secondary | ICD-10-CM | POA: Diagnosis not present

## 2023-09-02 DIAGNOSIS — M1711 Unilateral primary osteoarthritis, right knee: Secondary | ICD-10-CM | POA: Diagnosis not present

## 2023-09-03 DIAGNOSIS — M6281 Muscle weakness (generalized): Secondary | ICD-10-CM | POA: Diagnosis not present

## 2023-09-03 DIAGNOSIS — R2689 Other abnormalities of gait and mobility: Secondary | ICD-10-CM | POA: Diagnosis not present

## 2023-09-03 DIAGNOSIS — M47816 Spondylosis without myelopathy or radiculopathy, lumbar region: Secondary | ICD-10-CM | POA: Diagnosis not present

## 2023-09-06 DIAGNOSIS — M47816 Spondylosis without myelopathy or radiculopathy, lumbar region: Secondary | ICD-10-CM | POA: Diagnosis not present

## 2023-09-06 DIAGNOSIS — M6281 Muscle weakness (generalized): Secondary | ICD-10-CM | POA: Diagnosis not present

## 2023-09-06 DIAGNOSIS — R2689 Other abnormalities of gait and mobility: Secondary | ICD-10-CM | POA: Diagnosis not present

## 2023-09-08 DIAGNOSIS — R2689 Other abnormalities of gait and mobility: Secondary | ICD-10-CM | POA: Diagnosis not present

## 2023-09-08 DIAGNOSIS — M47816 Spondylosis without myelopathy or radiculopathy, lumbar region: Secondary | ICD-10-CM | POA: Diagnosis not present

## 2023-09-08 DIAGNOSIS — M6281 Muscle weakness (generalized): Secondary | ICD-10-CM | POA: Diagnosis not present

## 2023-09-14 DIAGNOSIS — M47816 Spondylosis without myelopathy or radiculopathy, lumbar region: Secondary | ICD-10-CM | POA: Diagnosis not present

## 2023-09-14 DIAGNOSIS — M6281 Muscle weakness (generalized): Secondary | ICD-10-CM | POA: Diagnosis not present

## 2023-09-14 DIAGNOSIS — R2689 Other abnormalities of gait and mobility: Secondary | ICD-10-CM | POA: Diagnosis not present

## 2023-09-17 DIAGNOSIS — M6281 Muscle weakness (generalized): Secondary | ICD-10-CM | POA: Diagnosis not present

## 2023-09-17 DIAGNOSIS — R2689 Other abnormalities of gait and mobility: Secondary | ICD-10-CM | POA: Diagnosis not present

## 2023-09-17 DIAGNOSIS — M47816 Spondylosis without myelopathy or radiculopathy, lumbar region: Secondary | ICD-10-CM | POA: Diagnosis not present

## 2023-09-20 DIAGNOSIS — M6281 Muscle weakness (generalized): Secondary | ICD-10-CM | POA: Diagnosis not present

## 2023-09-20 DIAGNOSIS — M47816 Spondylosis without myelopathy or radiculopathy, lumbar region: Secondary | ICD-10-CM | POA: Diagnosis not present

## 2023-09-20 DIAGNOSIS — R2689 Other abnormalities of gait and mobility: Secondary | ICD-10-CM | POA: Diagnosis not present

## 2023-09-23 DIAGNOSIS — I10 Essential (primary) hypertension: Secondary | ICD-10-CM | POA: Diagnosis not present

## 2023-09-23 DIAGNOSIS — M6281 Muscle weakness (generalized): Secondary | ICD-10-CM | POA: Diagnosis not present

## 2023-09-23 DIAGNOSIS — M47816 Spondylosis without myelopathy or radiculopathy, lumbar region: Secondary | ICD-10-CM | POA: Diagnosis not present

## 2023-09-23 DIAGNOSIS — N1831 Chronic kidney disease, stage 3a: Secondary | ICD-10-CM | POA: Diagnosis not present

## 2023-09-23 DIAGNOSIS — R2689 Other abnormalities of gait and mobility: Secondary | ICD-10-CM | POA: Diagnosis not present

## 2023-09-27 DIAGNOSIS — R2689 Other abnormalities of gait and mobility: Secondary | ICD-10-CM | POA: Diagnosis not present

## 2023-09-27 DIAGNOSIS — M47816 Spondylosis without myelopathy or radiculopathy, lumbar region: Secondary | ICD-10-CM | POA: Diagnosis not present

## 2023-09-27 DIAGNOSIS — M6281 Muscle weakness (generalized): Secondary | ICD-10-CM | POA: Diagnosis not present

## 2023-09-30 DIAGNOSIS — M6281 Muscle weakness (generalized): Secondary | ICD-10-CM | POA: Diagnosis not present

## 2023-09-30 DIAGNOSIS — R2689 Other abnormalities of gait and mobility: Secondary | ICD-10-CM | POA: Diagnosis not present

## 2023-09-30 DIAGNOSIS — M47816 Spondylosis without myelopathy or radiculopathy, lumbar region: Secondary | ICD-10-CM | POA: Diagnosis not present

## 2023-10-05 DIAGNOSIS — M6281 Muscle weakness (generalized): Secondary | ICD-10-CM | POA: Diagnosis not present

## 2023-10-05 DIAGNOSIS — R2689 Other abnormalities of gait and mobility: Secondary | ICD-10-CM | POA: Diagnosis not present

## 2023-10-05 DIAGNOSIS — M47816 Spondylosis without myelopathy or radiculopathy, lumbar region: Secondary | ICD-10-CM | POA: Diagnosis not present

## 2023-10-06 DIAGNOSIS — H43393 Other vitreous opacities, bilateral: Secondary | ICD-10-CM | POA: Diagnosis not present

## 2023-10-13 DIAGNOSIS — M47816 Spondylosis without myelopathy or radiculopathy, lumbar region: Secondary | ICD-10-CM | POA: Diagnosis not present

## 2023-10-13 DIAGNOSIS — R2689 Other abnormalities of gait and mobility: Secondary | ICD-10-CM | POA: Diagnosis not present

## 2023-10-13 DIAGNOSIS — M6281 Muscle weakness (generalized): Secondary | ICD-10-CM | POA: Diagnosis not present

## 2023-10-15 DIAGNOSIS — M175 Other unilateral secondary osteoarthritis of knee: Secondary | ICD-10-CM | POA: Diagnosis not present

## 2023-10-15 DIAGNOSIS — D72819 Decreased white blood cell count, unspecified: Secondary | ICD-10-CM | POA: Diagnosis not present

## 2023-10-15 DIAGNOSIS — M23206 Derangement of unspecified meniscus due to old tear or injury, right knee: Secondary | ICD-10-CM | POA: Diagnosis not present

## 2023-10-15 DIAGNOSIS — R2689 Other abnormalities of gait and mobility: Secondary | ICD-10-CM | POA: Diagnosis not present

## 2023-10-15 DIAGNOSIS — M47816 Spondylosis without myelopathy or radiculopathy, lumbar region: Secondary | ICD-10-CM | POA: Diagnosis not present

## 2023-10-15 DIAGNOSIS — M6281 Muscle weakness (generalized): Secondary | ICD-10-CM | POA: Diagnosis not present

## 2023-10-15 DIAGNOSIS — Z01818 Encounter for other preprocedural examination: Secondary | ICD-10-CM | POA: Diagnosis not present

## 2023-10-22 ENCOUNTER — Ambulatory Visit: Payer: Medicare Other | Admitting: Cardiology

## 2023-10-24 DIAGNOSIS — J329 Chronic sinusitis, unspecified: Secondary | ICD-10-CM | POA: Diagnosis not present

## 2023-10-24 DIAGNOSIS — J411 Mucopurulent chronic bronchitis: Secondary | ICD-10-CM | POA: Diagnosis not present

## 2023-10-24 DIAGNOSIS — I10 Essential (primary) hypertension: Secondary | ICD-10-CM | POA: Diagnosis not present

## 2023-10-24 DIAGNOSIS — N1831 Chronic kidney disease, stage 3a: Secondary | ICD-10-CM | POA: Diagnosis not present

## 2023-10-26 ENCOUNTER — Encounter (HOSPITAL_BASED_OUTPATIENT_CLINIC_OR_DEPARTMENT_OTHER): Payer: Self-pay

## 2023-10-26 DIAGNOSIS — N1831 Chronic kidney disease, stage 3a: Secondary | ICD-10-CM | POA: Diagnosis not present

## 2023-10-26 DIAGNOSIS — I10 Essential (primary) hypertension: Secondary | ICD-10-CM | POA: Diagnosis not present

## 2023-10-27 ENCOUNTER — Ambulatory Visit: Attending: Cardiology | Admitting: Cardiology

## 2023-10-27 ENCOUNTER — Encounter: Payer: Self-pay | Admitting: Cardiology

## 2023-10-27 VITALS — BP 140/88 | HR 55 | Resp 16 | Ht 59.0 in | Wt 141.4 lb

## 2023-10-27 DIAGNOSIS — Z01810 Encounter for preprocedural cardiovascular examination: Secondary | ICD-10-CM | POA: Diagnosis not present

## 2023-10-27 DIAGNOSIS — R931 Abnormal findings on diagnostic imaging of heart and coronary circulation: Secondary | ICD-10-CM

## 2023-10-27 DIAGNOSIS — R001 Bradycardia, unspecified: Secondary | ICD-10-CM | POA: Diagnosis not present

## 2023-10-27 DIAGNOSIS — I10 Essential (primary) hypertension: Secondary | ICD-10-CM

## 2023-10-27 NOTE — Patient Instructions (Signed)
 Medication Instructions:  Your physician recommends that you continue on your current medications as directed. Please refer to the Current Medication list given to you today.  *If you need a refill on your cardiac medications before your next appointment, please call your pharmacy*   Follow-Up: At [facility], you and your health needs are our priority.  As part of our continuing mission to provide you with exceptional heart care, our providers are all part of one team.  This team includes your primary Cardiologist (physician) and Advanced Practice Providers or APPs (Physician Assistants and Nurse Practitioners) who all work together to provide you with the care you need, when you need it.  Your next appointment:   12 month(s)  Provider:   Madonna Large, DO    We recommend signing up for the patient portal called MyChart.  Sign up information is provided on this After Visit Summary.  MyChart is used to connect with patients for Virtual Visits (Telemedicine).  Patients are able to view lab/test results, encounter notes, upcoming appointments, etc.  Non-urgent messages can be sent to your provider as well.   To learn more about what you can do with MyChart, go to [email or web address].

## 2023-10-27 NOTE — Progress Notes (Signed)
 Cardiology Office Note:  .   Date:  10/27/2023  ID:  Lindajo Given, DOB 1940/10/29, MRN [id number] PCP:  Anastasio Duncans, DO (Inactive)  Former Cardiology Providers: None [facility] Providers Cardiologist:  Madonna Large, DO , [facility] (established care 09/05/2019) Electrophysiologist:  None  Click to update primary MD,subspecialty MD or APP then REFRESH:1}    Chief Complaint  Patient presents with   Sinus bradycardia   Follow-up    History of Present Illness: .   Wendy [name] is a 83 y.o. African-American female whose past medical history and cardiovascular risk factors includes: Mild coronary artery calcification, Hx of COVID 19 (July 2022), Sinus bradycardia, hypertension, postmenopausal female, advanced age.   Patient was referred to the practice for evaluation of bradycardia.  She underwent appropriate testing and follows up an annual basis.  Over the last 1 year she denies anginal chest pain or heart failure symptoms.  No near-syncope or syncopal events. Currently being treated for bronchitis and recovering well. Plans to undergo right knee replacement on November 16, 2023.  Review of Systems: .   Review of Systems  Cardiovascular:  Negative for chest pain, claudication, irregular heartbeat, leg swelling, near-syncope, orthopnea, palpitations, paroxysmal nocturnal dyspnea and syncope.  Respiratory:  Negative for shortness of breath.   Hematologic/Lymphatic: Negative for bleeding problem.    Studies Reviewed:   EKG: EKG Interpretation Date/Time:  Wednesday October 27 2023 12:56:57 EDT Ventricular Rate:  56 PR Interval:  182 QRS Duration:  80 QT Interval:  418 QTC Calculation: 403 R Axis:   -13  Text Interpretation: Sinus bradycardia Nonspecific ST abnormality When compared with ECG of 09-Oct-2021 09:48, No significant change since last tracing Confirmed by Large Madonna [phone]) on 10/27/2023 1:05:39 PM  Echocardiogram: 11/14/2021: Normal LV systolic function with visual  EF 60-65%. Left ventricle cavity is normal in size. Normal left ventricular wall thickness. Normal global wall motion. Normal diastolic filling pattern, normal LAP.  Mild tricuspid regurgitation. No evidence of pulmonary hypertension. RVSP measures 30 mmHg. Compared to 09/08/2019: Mild MR is now not appreciated otherwise no significant change    Stress Testing: Exercise Myoview  stress test 09/25/2019: Low risk study.  24 hour Holter monitor: Dominant rhythm normal sinus. Heart rate 42-154 bpm.  Average heart rate 61 bpm. Minimum heart rate of 42 bpm occurred at 4:10am on 09/09/2019.  No atrial fibrillation/ventricular tachycardia/high grade AV block, sinus pause greater than or equal to 3 seconds in duration. Total ventricular ectopic burden <1%. Total supraventricular ectopic burden <1%. One auto detected episode of supraventricular tachycardia was 11 beats in duration at a maximum rate of 154 bpm. Number of patient triggered events: 2.  Underlying rhythm is normal sinus without any significant dysrhythmias.   CT Cardiac Scoring: 10/23/2021 Total CAC 31.7 AU, 44th percentile for patient's age, sex, and race. Aortic atherosclerosis Noncardiac findings: None   RADIOLOGY: NA  Risk Assessment/Calculations:   NA   Labs:       Latest Ref Rng & Units 11/17/2022   11:53 AM 05/13/2022   11:02 AM 10/09/2021    9:56 AM  CBC  WBC 4.0 - 10.5 K/uL 2.9  2.2  3.0   Hemoglobin 12.0 - 15.0 g/dL 86.7  87.0  87.3   Hematocrit 36.0 - 46.0 % 39.4  38.1  36.9   Platelets 150 - 400 K/uL 174  141  150        Latest Ref Rng & Units 11/17/2022   11:53 AM 10/09/2021    9:56 AM 09/20/2018  2:21 PM  BMP  Glucose 70 - 99 mg/dL 92  888  81   BUN 8 - 23 mg/dL 24  22  14    Creatinine 0.44 - 1.00 mg/dL 8.92  8.86  8.98   BUN/Creat Ratio 12 - 28   14   Sodium 135 - 145 mmol/L 138  137  140   Potassium 3.5 - 5.1 mmol/L 3.5  3.7  3.6   Chloride 98 - 111 mmol/L 98  101  100   CO2 22 - 32 mmol/L 29  27  25     Calcium 8.9 - 10.3 mg/dL 9.7  9.5  89.9       Latest Ref Rng & Units 11/17/2022   11:53 AM 10/09/2021    9:56 AM 09/20/2018    2:21 PM  CMP  Glucose 70 - 99 mg/dL 92  888  81   BUN 8 - 23 mg/dL 24  22  14    Creatinine 0.44 - 1.00 mg/dL 8.92  8.86  8.98   Sodium 135 - 145 mmol/L 138  137  140   Potassium 3.5 - 5.1 mmol/L 3.5  3.7  3.6   Chloride 98 - 111 mmol/L 98  101  100   CO2 22 - 32 mmol/L 29  27  25    Calcium 8.9 - 10.3 mg/dL 9.7  9.5  89.9   Total Protein 6.5 - 8.1 g/dL 7.0   7.2   Total Bilirubin 0.3 - 1.2 mg/dL 1.4   0.8   Alkaline Phos 38 - 126 U/L 42   49   AST 15 - 41 U/L 24   22   ALT 0 - 44 U/L 26   20     External Labs 04/14/2022 [facility] Database Total cholesterol 174, HDL 87, LDL 73, TAG 74   Physical Exam:    Today's Vitals   10/27/23 1248  BP: (!) 140/88  Pulse: (!) 55  Resp: 16  SpO2: 97%  Weight: 141 lb 6.4 oz (64.1 kg)  Height: 4' 11 (1.499 m)   Body mass index is 28.56 kg/m. Wt Readings from Last 3 Encounters:  10/27/23 141 lb 6.4 oz (64.1 kg)  07/23/23 143 lb (64.9 kg)  11/24/22 143 lb 12.8 oz (65.2 kg)    Physical Exam  Constitutional: No distress.  hemodynamically stable  Neck: No JVD present.  Cardiovascular: Regular rhythm, S1 normal and S2 normal. Bradycardia present. Exam reveals no gallop, no S3 and no S4.  No murmur heard. Pulmonary/Chest: Effort normal and breath sounds normal. No stridor. She has no wheezes. She has no rales.  Musculoskeletal:        General: No edema.     Cervical back: Neck supple.  Skin: Skin is warm.     Impression & Recommendation(s):  Impression:   ICD-10-CM   1. Sinus bradycardia  R00.1 EKG 12-Lead    2. Agatston coronary artery calcium score less than 100  R93.1     3. Benign hypertension  I10     4. Preoperative cardiovascular examination  Z01.810        Recommendation(s):  Sinus bradycardia Asymptomatic. Has illustrated good chronotropic competence on her prior stress testing No  obvious reversible cause. Not on AV nodal blocking agents. Monitor for now  Agatston coronary artery calcium score less than 100 Denies anginal chest pain or heart failure symptoms. EKG nonischemic Currently on aspirin 81 mg p.o. daily. Prefers not to be on lipid-lowering agents. Implementing lifestyle modifications.  Most recent  LDL 73 mg/dL (08/7973)  Benign hypertension Currently on losartan/hydrochlorothiazide 50/12.5 mg p.o. daily. Office blood pressures upper limits of normal, likely exacerbated by her underlying bronchitis. Continue to monitor blood pressures at home Cardiology following peripherally, managed by primary care provider.  Pre-op cardiovascular examination  W/ [facility] under the care of Dr. Kay  According to the Revised Cardiac Risk Index (RCRI), her Perioperative Risk of Major Cardiac Event is (%): 0.4 Her Functional Capacity in METs is: 6.27 according to the Duke Activity Status Index (DASI). EKG is nonischemic. Patient is considered acceptable risk for upcoming noncardiac surgery May hold aspirin 7 days prior to the procedure and restart once medically safe/cleared by surgical team. Patient and husband except her preoperative risk assessment  Orders Placed:  Orders Placed This Encounter  Procedures   EKG 12-Lead     Final Medication List:   No orders of the defined types were placed in this encounter.   Medications Discontinued During This Encounter  Medication Reason   oxyCODONE  (ROXICODONE ) 5 MG immediate release tablet Patient Preference   benzonatate  (TESSALON ) 100 MG capsule Patient Preference     Current Outpatient Medications:    amoxicillin-clavulanate (AUGMENTIN) 875-125 MG tablet, Take 1 tablet by mouth 2 (two) times daily., Disp: , Rfl:    Ascorbic Acid (VITAMIN C) 500 MG CAPS, 2 capsules, Disp: , Rfl:    aspirin EC 81 MG tablet, Take 81 mg by mouth daily., Disp: , Rfl:    cholecalciferol (VITAMIN D3) 25 MCG (1000 UNIT) tablet,  Take 1,000 Units by mouth daily., Disp: , Rfl:    Glucosamine 500 MG CAPS, in the morning and at bedtime., Disp: , Rfl:    losartan-hydrochlorothiazide (HYZAAR) 50-12.5 MG tablet, Take 1 tablet by mouth daily., Disp: , Rfl:    Multiple Vitamin (MULTIVITAMIN) tablet, Take 1 tablet by mouth daily., Disp: , Rfl:    Omega-3 Fatty Acids (FISH OIL OMEGA-3 PO), Take 1 capsule by mouth., Disp: , Rfl:   Consent:   NA  Disposition:   1 year sooner if needed  Her questions and concerns were addressed to her satisfaction. She voices understanding of the recommendations provided during this encounter.    Signed, Madonna Michele HAS, [facility]  A Division of [facility] [address] [place], [place] [address]  10/27/2023 1:24 PM

## 2023-10-28 ENCOUNTER — Encounter: Payer: Self-pay | Admitting: Physician Assistant

## 2023-10-28 NOTE — Progress Notes (Signed)
 Patient contacted nurse triage line to report that she is scheduled for surgery on 11/19/2023, and requested documentation that her white blood cells are chronically low.    To my knowledge, we have not received any request for surgical clearance from her surgeon.  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 Further details of diagnosis as per office note from 11/24/2022. From hematology standpoint, there is no contraindication to surgery as long as her white blood cells are within her usual baseline range.  Pleasant [name], PA-C 10/28/23 2:56 PM

## 2023-11-01 NOTE — Progress Notes (Signed)
 Sent message, via epic in basket, requesting orders in epic from Careers adviser.

## 2023-11-04 NOTE — H&P (Signed)
 Patient's anticipated LOS is less than 2 midnights, meeting these requirements: - Younger than 38 - Lives within 1 hour of care - Has a competent adult at home to recover with post-op recover - NO history of  - Chronic pain requiring opiods  - Diabetes  - Coronary Artery Disease  - Heart failure  - Heart attack  - Stroke  - DVT/VTE  - Cardiac arrhythmia  - Respiratory Failure/COPD  - Renal failure  - Anemia  - Advanced Liver disease     Wendy [name] is an 83 y.o. female.    Chief Complaint: right knee pain  HPI: Pt is a 83 y.o. female complaining of right knee pain for multiple years. Pain had continually increased since the beginning. X-rays in the clinic show end-stage arthritic changes of the right knee. Pt has tried various conservative treatments which have failed to alleviate their symptoms, including injections and therapy. Various options are discussed with the patient. Risks, benefits and expectations were discussed with the patient. Patient understand the risks, benefits and expectations and wishes to proceed with surgery.   PCP:  Anastasio Duncans, DO (Inactive)  D/C Plans: Home  PMH: Past Medical History:  Diagnosis Date   Arthritis    hands, neck lower back   Cataract    extracted   GERD (gastroesophageal reflux disease)    prior med   Hypertension    Lumbar stenosis     PSH: Past Surgical History:  Procedure Laterality Date   CATARACT EXTRACTION, BILATERAL     LESION REMOVAL Right 10/21/2022   Procedure: EXCISION SKIN LESION RIGHT CALF;  Surgeon: Curvin Deward MOULD, MD;  Location: [facility];  Service: General;  Laterality: Right;  46   SHOULDER ARTHROSCOPY Right    SHOULDER ARTHROTOMY Left    about 2007    Social History:  reports that she has never smoked. She has never used smokeless tobacco. She reports that she does not drink alcohol and does not use drugs. BMI: Estimated body mass index is 28.56 kg/m as calculated from the following:    Height as of 10/27/23: 4' 11 (1.499 m).   Weight as of 10/27/23: 64.1 kg.  Lab Results  Component Value Date   ALBUMIN 4.1 11/17/2022   Diabetes: Patient does not have a diagnosis of diabetes.     Smoking Status:   reports that she has never smoked. She has never used smokeless tobacco.    Allergies:  No Known Allergies  Medications: No current facility-administered medications for this encounter.   Current Outpatient Medications  Medication Sig Dispense Refill   albuterol (VENTOLIN HFA) 108 (90 Base) MCG/ACT inhaler Inhale 2 puffs into the lungs every 6 (six) hours as needed (Bronchitis).     Ascorbic Acid (VITAMIN C) 1000 MG tablet Take 1,000 mg by mouth daily.     aspirin EC 81 MG tablet Take 81 mg by mouth daily.     Cholecalciferol (VITAMIN D ) 50 MCG (2000 UT) CAPS Take 2,000 Units by mouth daily.     Glucosamine HCl (GLUCOSAMINE PO) Take 2 tablets by mouth daily.     hydrocortisone cream 1 % Apply 1 Application topically daily as needed for itching.     losartan-hydrochlorothiazide (HYZAAR) 50-12.5 MG tablet Take 1 tablet by mouth daily.     Multiple Vitamin (MULTIVITAMIN) tablet Take 1 tablet by mouth daily.     Omega-3 Fatty Acids (FISH OIL OMEGA-3 PO) Take 500 mg by mouth daily.     Polyethyl Glycol-Propyl  Glycol (SYSTANE) 0.4-0.3 % GEL ophthalmic gel Place 1 Application into both eyes 2 (two) times daily.      No results found for this or any previous visit (from the past 48 hours). No results found.  ROS: Pain with rom of the right lower extremity  Physical Exam: Alert and oriented 83 y.o. female in no acute distress Cranial nerves 2-12 intact Cervical spine: full rom with no tenderness, nv intact distally Chest: active breath sounds bilaterally, no wheeze rhonchi or rales Heart: regular rate and rhythm, no murmur Abd: non tender non distended with active bowel sounds Hip is stable with rom  Right knee pain with rom Nv intact distally No rashes or  edema  Assessment/Plan Assessment: right knee end stage osteoarthritis  Plan:  Patient will undergo a right total knee by Dr. Kay at [place] Risks benefits and expectations were discussed with the patient. Patient understand risks, benefits and expectations and wishes to proceed. Preoperative templating of the joint replacement has been completed, documented, and submitted to the Operating Room personnel in order to optimize intra-operative equipment management.   Arvella Fireman PA-C, MPAS [facility] is now [employer] [address], [place], [place] [address] Phone: [phone] [email or web address] Facebook  [employer]

## 2023-11-05 DIAGNOSIS — R2689 Other abnormalities of gait and mobility: Secondary | ICD-10-CM | POA: Diagnosis not present

## 2023-11-05 DIAGNOSIS — M6281 Muscle weakness (generalized): Secondary | ICD-10-CM | POA: Diagnosis not present

## 2023-11-05 DIAGNOSIS — M47816 Spondylosis without myelopathy or radiculopathy, lumbar region: Secondary | ICD-10-CM | POA: Diagnosis not present

## 2023-11-05 NOTE — Patient Instructions (Addendum)
 SURGICAL WAITING ROOM VISITATION  Patients having surgery or a procedure may have no more than 2 support people in the waiting area - these visitors may rotate.    Children under the age of 72 must have an adult with them who is not the patient.  Visitors with respiratory illnesses are discouraged from visiting and should remain at home.  If the patient needs to stay at the hospital during part of their recovery, the visitor guidelines for inpatient rooms apply. Pre-op nurse will coordinate an appropriate time for 1 support person to accompany patient in pre-op.  This support person may not rotate.    Please refer to the [facility] website for the visitor guidelines for Inpatients (after your surgery is over and you are in a regular room).    Your procedure is scheduled on: 11/19/23   Report to [facility] Main Entrance    Report to admitting at 12:20 PM   Call this number if you have problems the morning of surgery [phone]   Do not eat food :After Midnight.   After Midnight you may have the following liquids until 11:50 AM DAY OF SURGERY  Water Non-Citrus Juices (without pulp, NO RED-Apple, White grape, White cranberry) Black Coffee (NO MILK/CREAM OR CREAMERS, sugar ok)  Clear Tea (NO MILK/CREAM OR CREAMERS, sugar ok) regular and decaf                             Plain Jell-O (NO RED)                                           Fruit ices (not with fruit pulp, NO RED)                                     Popsicles (NO RED)                                                               Sports drinks like Gatorade (NO RED)               The day of surgery:  Drink ONE (1) Pre-Surgery Clear Ensure at 11:50 AM the morning of surgery. Drink in one sitting. Do not sip.  This drink was given to you during your hospital  pre-op appointment visit. Nothing else to drink after completing the  Pre-Surgery Clear Ensure.          If you have questions, please contact your  surgeon's office.   FOLLOW BOWEL PREP AND ANY ADDITIONAL PRE OP INSTRUCTIONS YOU RECEIVED FROM YOUR SURGEON'S OFFICE!!!     Oral Hygiene is also important to reduce your risk of infection.                                    Remember - BRUSH YOUR TEETH THE MORNING OF SURGERY WITH YOUR REGULAR TOOTHPASTE  DENTURES WILL BE REMOVED PRIOR TO SURGERY PLEASE DO NOT APPLY Poly grip OR ADHESIVES!!!  Stop all vitamins and herbal supplements 7 days before surgery.   Take these medicines the morning of surgery with A SIP OF WATER: Inhalers                              You may not have any metal on your body including hair pins, jewelry, and body piercing             Do not wear make-up, lotions, powders, perfumes, or deodorant  Do not wear nail polish including gel and S&S, artificial/acrylic nails, or any other type of covering on natural nails including finger and toenails. If you have artificial nails, gel coating, etc. that needs to be removed by a nail salon please have this removed prior to surgery or surgery may need to be canceled/ delayed if the surgeon/ anesthesia feels like they are unable to be safely monitored.   Do not shave  48 hours prior to surgery.    Do not bring valuables to the hospital. [facility] IS NOT             RESPONSIBLE   FOR VALUABLES.   Contacts, glasses, dentures or bridgework may not be worn into surgery.   Bring small overnight bag day of surgery.   DO NOT BRING YOUR HOME MEDICATIONS TO THE HOSPITAL. PHARMACY WILL DISPENSE MEDICATIONS LISTED ON YOUR MEDICATION LIST TO YOU DURING YOUR ADMISSION IN THE HOSPITAL!    Special Instructions: Bring a copy of your healthcare power of attorney and living will documents the day of surgery if you haven't scanned them before.              Please read over the following fact sheets you were given: IF YOU HAVE QUESTIONS ABOUT YOUR PRE-OP INSTRUCTIONS PLEASE CALL [phone].   If you received a COVID test  during your pre-op visit  it is requested that you wear a mask when out in public, stay away from anyone that may not be feeling well and notify your surgeon if you develop symptoms. If you test positive for Covid or have been in contact with anyone that has tested positive in the last 10 days please notify you surgeon.      Pre-operative 5 CHG Bath Instructions   You can play a key role in reducing the risk of infection after surgery. Your skin needs to be as free of germs as possible. You can reduce the number of germs on your skin by washing with CHG (chlorhexidine  gluconate) soap before surgery. CHG is an antiseptic soap that kills germs and continues to kill germs even after washing.   DO NOT use if you have an allergy to chlorhexidine /CHG or antibacterial soaps. If your skin becomes reddened or irritated, stop using the CHG and notify one of our RNs at [phone].   Please shower with the CHG soap starting 4 days before surgery using the following schedule:     Please keep in mind the following:  DO NOT shave, including legs and underarms, starting the day of your first shower.   You may shave your face at any point before/day of surgery.  Place clean sheets on your bed the day you start using CHG soap. Use a clean washcloth (not used since being washed) for each shower. DO NOT sleep with pets once you start using the CHG.   CHG Shower Instructions:  If you choose to wash your hair and private area,  wash first with your normal shampoo/soap.  After you use shampoo/soap, rinse your hair and body thoroughly to remove shampoo/soap residue.  Turn the water OFF and apply about 3 tablespoons (45 ml) of CHG soap to a CLEAN washcloth.  Apply CHG soap ONLY FROM YOUR NECK DOWN TO YOUR TOES (washing for 3-5 minutes)  DO NOT use CHG soap on face, private areas, open wounds, or sores.  Pay special attention to the area where your surgery is being performed.  If you are having back surgery,  having someone wash your back for you may be helpful. Wait 2 minutes after CHG soap is applied, then you may rinse off the CHG soap.  Pat dry with a clean towel  Put on clean clothes/pajamas   If you choose to wear lotion, please use ONLY the CHG-compatible lotions on the back of this paper.     Additional instructions for the day of surgery: DO NOT APPLY any lotions, deodorants, cologne, or perfumes.   Put on clean/comfortable clothes.  Brush your teeth.  Ask your nurse before applying any prescription medications to the skin.      CHG Compatible Lotions   Aveeno Moisturizing lotion  Cetaphil Moisturizing Cream  Cetaphil Moisturizing Lotion  Clairol Herbal Essence Moisturizing Lotion, Dry Skin  Clairol Herbal Essence Moisturizing Lotion, Extra Dry Skin  Clairol Herbal Essence Moisturizing Lotion, Normal Skin  Curel Age Defying Therapeutic Moisturizing Lotion with Alpha Hydroxy  Curel Extreme Care Body Lotion  Curel Soothing Hands Moisturizing Hand Lotion  Curel Therapeutic Moisturizing Cream, Fragrance-Free  Curel Therapeutic Moisturizing Lotion, Fragrance-Free  Curel Therapeutic Moisturizing Lotion, Original Formula  Eucerin Daily Replenishing Lotion  Eucerin Dry Skin Therapy Plus Alpha Hydroxy Crme  Eucerin Dry Skin Therapy Plus Alpha Hydroxy Lotion  Eucerin Original Crme  Eucerin Original Lotion  Eucerin Plus Crme Eucerin Plus Lotion  Eucerin TriLipid Replenishing Lotion  Keri Anti-Bacterial Hand Lotion  Keri Deep Conditioning Original Lotion Dry Skin Formula Softly Scented  Keri Deep Conditioning Original Lotion, Fragrance Free Sensitive Skin Formula  Keri Lotion Fast Absorbing Fragrance Free Sensitive Skin Formula  Keri Lotion Fast Absorbing Softly Scented Dry Skin Formula  Keri Original Lotion  Keri Skin Renewal Lotion Keri Silky Smooth Lotion  Keri Silky Smooth Sensitive Skin Lotion  Nivea Body Creamy Conditioning Oil  Nivea Body Extra Enriched Lotion  Nivea  Body Original Lotion  Nivea Body Sheer Moisturizing Lotion Nivea Crme  Nivea Skin Firming Lotion  NutraDerm 30 Skin Lotion  NutraDerm Skin Lotion  NutraDerm Therapeutic Skin Cream  NutraDerm Therapeutic Skin Lotion  ProShield Protective Hand Cream  Provon moisturizing lotion   View Pre-Surgery Education Videos:  [email or web address]     Incentive Spirometer  An incentive spirometer is a tool that can help keep your lungs clear and active. This tool measures how well you are filling your lungs with each breath. Taking long deep breaths may help reverse or decrease the chance of developing breathing (pulmonary) problems (especially infection) following: A long period of time when you are unable to move or be active. BEFORE THE PROCEDURE  If the spirometer includes an indicator to show your best effort, your nurse or respiratory therapist will set it to a desired goal. If possible, sit up straight or lean slightly forward. Try not to slouch. Hold the incentive spirometer in an upright position. INSTRUCTIONS FOR USE  Sit on the edge of your bed if possible, or sit up as far as you can in bed or on a chair. Hold  the incentive spirometer in an upright position. Breathe out normally. Place the mouthpiece in your mouth and seal your lips tightly around it. Breathe in slowly and as deeply as possible, raising the piston or the ball toward the top of the column. Hold your breath for 3-5 seconds or for as long as possible. Allow the piston or ball to fall to the bottom of the column. Remove the mouthpiece from your mouth and breathe out normally. Rest for a few seconds and repeat Steps 1 through 7 at least 10 times every 1-2 hours when you are awake. Take your time and take a few normal breaths between deep breaths. The spirometer may include an indicator to show your best effort. Use the indicator as a goal to work toward during each  repetition. After each set of 10 deep breaths, practice coughing to be sure your lungs are clear. If you have an incision (the cut made at the time of surgery), support your incision when coughing by placing a pillow or rolled up towels firmly against it. Once you are able to get out of bed, walk around indoors and cough well. You may stop using the incentive spirometer when instructed by your caregiver.  RISKS AND COMPLICATIONS Take your time so you do not get dizzy or light-headed. If you are in pain, you may need to take or ask for pain medication before doing incentive spirometry. It is harder to take a deep breath if you are having pain. AFTER USE Rest and breathe slowly and easily. It can be helpful to keep track of a log of your progress. Your caregiver can provide you with a simple table to help with this. If you are using the spirometer at home, follow these instructions: SEEK MEDICAL CARE IF:  You are having difficultly using the spirometer. You have trouble using the spirometer as often as instructed. Your pain medication is not giving enough relief while using the spirometer. You develop fever of 100.5 F (38.1 C) or higher. SEEK IMMEDIATE MEDICAL CARE IF:  You cough up bloody sputum that had not been present before. You develop fever of 102 F (38.9 C) or greater. You develop worsening pain at or near the incision site. MAKE SURE YOU:  Understand these instructions. Will watch your condition. Will get help right away if you are not doing well or get worse. Document Released: 06/22/2006 Document Revised: 05/04/2011 Document Reviewed: 08/23/2006 [facility] Patient Information 2014 [place], [place].   ________________________________________________________________________

## 2023-11-05 NOTE — Progress Notes (Addendum)
 COVID Vaccine Completed:  Date of COVID positive in last 90 days:  PCP - Mabel Pleas, PA Cardiologist - Sunit Tolia, DO LOV 10/27/23 Haematologist- Pleasant Barefoot, PA  Cardiac clearance by Madonna Large, DO 10/27/23 in Epic  Chest x-ray - 02/03/23 Epic EKG - 10/27/23 Epic Stress Test - within 5 years ECHO - 11/14/21 Epic Cardiac Cath - n/a Pacemaker/ICD device last checked:N/A Spinal Cord Stimulator:N/A  Bowel Prep - N/A  Sleep Study - N/A CPAP -   Fasting Blood Sugar - N/A Checks Blood Sugar _____ times a day  Last dose of GLP1 agonist-  N/A GLP1 instructions:  Do not take after     Last dose of SGLT-2 inhibitors-  N/A SGLT-2 instructions:  Do not take after     Blood Thinner Instructions: N/A Last dose:   Time: Aspirin Instructions: ASA 81, hold 7 days Last Dose:  Activity level: Can go up a flight of stairs and perform activities of daily living without stopping and without symptoms of chest pain or shortness of breath.  Anesthesia review: MVP, HTN, leukopenia   Patient denies shortness of breath, fever, cough and chest pain at PAT appointment  Patient verbalized understanding of instructions that were given to them at the PAT appointment. Patient was also instructed that they will need to review over the PAT instructions again at home before surgery.

## 2023-11-08 ENCOUNTER — Encounter (HOSPITAL_COMMUNITY)
Admission: RE | Admit: 2023-11-08 | Discharge: 2023-11-08 | Disposition: A | Discharge status: Home / Self Care | Source: Ambulatory Visit | Attending: Orthopedic Surgery | Admitting: Orthopedic Surgery

## 2023-11-08 ENCOUNTER — Encounter (HOSPITAL_COMMUNITY): Payer: Self-pay

## 2023-11-08 ENCOUNTER — Other Ambulatory Visit: Payer: Self-pay

## 2023-11-08 ENCOUNTER — Encounter: Payer: Self-pay | Admitting: Physician Assistant

## 2023-11-08 VITALS — BP 149/77 | HR 61 | Temp 98.4°F | Resp 20 | Ht <= 58 in | Wt 138.0 lb

## 2023-11-08 DIAGNOSIS — M1711 Unilateral primary osteoarthritis, right knee: Secondary | ICD-10-CM | POA: Diagnosis not present

## 2023-11-08 DIAGNOSIS — Z01818 Encounter for other preprocedural examination: Secondary | ICD-10-CM | POA: Diagnosis not present

## 2023-11-08 DIAGNOSIS — Z01812 Encounter for preprocedural laboratory examination: Secondary | ICD-10-CM | POA: Insufficient documentation

## 2023-11-08 DIAGNOSIS — I10 Essential (primary) hypertension: Secondary | ICD-10-CM | POA: Insufficient documentation

## 2023-11-08 HISTORY — DX: Bronchitis, not specified as acute or chronic: J40

## 2023-11-08 HISTORY — DX: Decreased white blood cell count, unspecified: D72.819

## 2023-11-08 HISTORY — DX: Other complications of anesthesia, initial encounter: T88.59XA

## 2023-11-08 HISTORY — DX: Nonrheumatic mitral (valve) prolapse: I34.1

## 2023-11-08 LAB — BASIC METABOLIC PANEL WITH GFR
Anion gap: 11 (ref 5–15)
BUN: 17 mg/dL (ref 8–23)
CO2: 25 mmol/L (ref 22–32)
Calcium: 9.8 mg/dL (ref 8.9–10.3)
Chloride: 101 mmol/L (ref 98–111)
Creatinine, Ser: 0.88 mg/dL (ref 0.44–1.00)
GFR, Estimated: >60 mL/min (ref >60)
Glucose, Bld: 102 mg/dL — ABNORMAL HIGH (ref 70–99)
Potassium: 3.5 mmol/L (ref 3.5–5.1)
Sodium: 137 mmol/L (ref 135–145)

## 2023-11-08 LAB — CBC
HCT: 38.2 % (ref 36.0–46.0)
Hemoglobin: 12.2 g/dL (ref 12.0–15.0)
MCH: 29.1 pg (ref 26.0–34.0)
MCHC: 31.9 g/dL (ref 30.0–36.0)
MCV: 91.2 fL (ref 80.0–100.0)
Platelets: 174 K/uL (ref 150–400)
RBC: 4.19 MIL/uL (ref 3.87–5.11)
RDW: 12.6 % (ref 11.5–15.5)
WBC: 2.7 K/uL — ABNORMAL LOW (ref 4.0–10.5)
nRBC: 0 % (ref 0.0–0.2)

## 2023-11-08 LAB — SURGICAL PCR SCREEN
MRSA, PCR: NEGATIVE
Staphylococcus aureus: NEGATIVE

## 2023-11-09 NOTE — Progress Notes (Signed)
 Case: [id number] Date/Time: 11/19/23 1434   Procedure: ARTHROPLASTY, KNEE, TOTAL (Right: Knee)   Anesthesia type: General   Diagnosis: Primary osteoarthritis of right knee [M17.11]   Pre-op diagnosis: Primary osteoarthritis of right knee   Location: [facility] ROOM 06 / [facility]   Surgeons: Wendy Kemps, MD       DISCUSSION: Wendy [name] is an 83 yo female with PMH of HTN, CAD (by CT), bradycardia, GERD, arthritis.  Prior complication from anesthesia includes hypotension (per patient. No prior anesthesia records to review)  Patient follows with Cardiology for above hx. Last seen on 10/27/23 for pre op clearance. Noted to be doing well. No symptoms. Tolerating medical therapy for CAD, HTN. Not on AV nodal blocking agents due to baseline bradycardia. Cleared for surgery:  Pre-op cardiovascular examination  W/ [facility] under the care of Dr. Kay  According to the Revised Cardiac Risk Index (RCRI), her Perioperative Risk of Major Cardiac Event is (%): 0.4 Her Functional Capacity in METs is: 6.27 according to the Duke Activity Status Index (DASI). EKG is nonischemic. Patient is considered acceptable risk for upcoming noncardiac surgery May hold aspirin 7 days prior to the procedure and restart once medically safe/cleared by surgical team. Patient and husband except her preoperative risk assessment    VS: BP (!) 149/77   Pulse 61   Temp 36.9 C (Oral)   Resp 20   Ht 4' 10 (1.473 m)   Wt 62.6 kg   SpO2 100%   BMI 28.84 kg/m   PROVIDERS: Wendy Netter, PA   LABS: Labs reviewed: Acceptable for surgery. (all labs ordered are listed, but only abnormal results are displayed)  Labs Reviewed  BASIC METABOLIC PANEL WITH GFR - Abnormal; Notable for the following components:      Result Value   Glucose, Bld 102 (*)    All other components within normal limits  CBC - Abnormal; Notable for the following components:   WBC 2.7 (*)    All other components within normal limits  SURGICAL  PCR SCREEN     IMAGES:   EKG 10/27/23  Sinus bradycardia Nonspecific ST abnormality   CV:   Echocardiogram 11/14/2021: Normal LV systolic function with visual EF 60-65%. Left ventricle cavity is normal in size. Normal left ventricular wall thickness. Normal global wall motion. Normal diastolic filling pattern, normal LAP.  Mild tricuspid regurgitation. No evidence of pulmonary hypertension. RVSP measures 30 mmHg. Compared to 09/08/2019: Mild MR is now not appreciated otherwise no significant change    CT Cardiac Scoring 10/22/2021: 10/23/2021 Total CAC 31.7 AU, 44th percentile for patient's age, sex, and race. Aortic atherosclerosis Noncardiac findings: None    Stress Testing 09/25/2019: Exercise nuclear stress test was performed using Bruce protocol. Patient reached 7.7 METS, and 91% of age predicted maximum heart rate. Exercise capacity was fair. Chest pain not reported. Normal heart rate and hemodynamic response.  Peak EKG/ECG demonstrated sinus tachycardia. occasional PAC's, 1 mm horizontal ST depression present in leads II, III, aVF, V4-V6. EKG changes normalized within 1 min into recovery. Given norma myocardial perfusion, EKG changes are likely false positive.  Normal myocardial perfusion. Stress LVEF 69%. Low risk study,   24 hour Holter monitor 09/08/2019: Dominant rhythm normal sinus. Heart rate 42-154 bpm.  Average heart rate 61 bpm. Minimum heart rate of 42 bpm occurred at 4:10am on 09/09/2019.  No atrial fibrillation/ventricular tachycardia/high grade AV block, sinus pause greater than or equal to 3 seconds in duration. Total ventricular ectopic burden <1%. Total supraventricular  ectopic burden <1%. One auto detected episode of supraventricular tachycardia was 11 beats in duration at a maximum rate of 154 bpm. Number of patient triggered events: 2.  Underlying rhythm is normal sinus without any significant dysrhythmias.  Past Medical History:  Diagnosis Date    Arthritis    hands, neck lower back   Bronchitis    Cataract    extracted   Complication of anesthesia    BP drops   GERD (gastroesophageal reflux disease)    prior med   Hypertension    Leukopenia    Lumbar stenosis    MVP (mitral valve prolapse)     Past Surgical History:  Procedure Laterality Date   BLEPHAROPLASTY Bilateral    CATARACT EXTRACTION, BILATERAL     COLONOSCOPY     LESION REMOVAL Right 10/21/2022   Procedure: EXCISION SKIN LESION RIGHT CALF;  Surgeon: Wendy Deward MOULD, MD;  Location: [facility];  Service: General;  Laterality: Right;  46   SHOULDER ARTHROSCOPY Right    SHOULDER ARTHROTOMY Left    about 2007    MEDICATIONS:  albuterol (VENTOLIN HFA) 108 (90 Base) MCG/ACT inhaler   Ascorbic Acid (VITAMIN C) 1000 MG tablet   aspirin EC 81 MG tablet   Cholecalciferol (VITAMIN D ) 50 MCG (2000 UT) CAPS   Glucosamine HCl (GLUCOSAMINE PO)   hydrocortisone cream 1 %   losartan-hydrochlorothiazide (HYZAAR) 50-12.5 MG tablet   Multiple Vitamin (MULTIVITAMIN) tablet   Omega-3 Fatty Acids (FISH OIL OMEGA-3 PO)   Polyethyl Glycol-Propyl Glycol (SYSTANE) 0.4-0.3 % GEL ophthalmic gel   No current facility-administered medications for this encounter.   Wendy [name]/[facility] Short Stay/Anesthesiology [facility] Phone [phone] 11/09/2023 1:30 PM

## 2023-11-09 NOTE — Anesthesia Preprocedure Evaluation (Addendum)
 Anesthesia Evaluation  Patient identified by MRN, date of birth, ID band Patient awake    Reviewed: Allergy & Precautions, NPO status , Patient's Chart, lab work & pertinent test results  Airway Mallampati: II  TM Distance: >3 FB Neck ROM: Full    Dental no notable dental hx.    Pulmonary neg pulmonary ROS   Pulmonary exam normal        Cardiovascular hypertension, Pt. on medications + CAD   Rhythm:Regular Rate:Normal  Echocardiogram 11/14/2021: Normal LV systolic function with visual EF 60-65%. Left ventricle cavity is normal in size. Normal left ventricular wall thickness. Normal global wall motion. Normal diastolic filling pattern, normal LAP.  Mild tricuspid regurgitation. No evidence of pulmonary hypertension. RVSP measures 30 mmHg. Compared to 09/08/2019: Mild MR is now not appreciated otherwise no significant change     Neuro/Psych negative neurological ROS  negative psych ROS   GI/Hepatic Neg liver ROS,GERD  ,,  Endo/Other  negative endocrine ROS    Renal/GU negative Renal ROS  negative genitourinary   Musculoskeletal  (+) Arthritis , Osteoarthritis,    Abdominal Normal abdominal exam  (+)   Peds  Hematology Lab Results      Component                Value               Date                      WBC                      2.7 (L)             11/08/2023                HGB                      12.2                11/08/2023                HCT                      38.2                11/08/2023                MCV                      91.2                11/08/2023                PLT                      174                 11/08/2023              Anesthesia Other Findings   Reproductive/Obstetrics                              Anesthesia Physical Anesthesia Plan  ASA: 3  Anesthesia Plan: MAC, Regional and Spinal   Post-op Pain Management: Regional block*   Induction:   PONV  Risk Score and Plan: Ondansetron , Dexamethasone , Propofol  infusion and Treatment may  vary due to age or medical condition  Airway Management Planned: Simple Face Mask and Nasal Cannula  Additional Equipment: None  Intra-op Plan:   Post-operative Plan:   Informed Consent: I have reviewed the patients History and Physical, chart, labs and discussed the procedure including the risks, benefits and alternatives for the proposed anesthesia with the patient or authorized representative who has indicated his/her understanding and acceptance.     Dental advisory given  Plan Discussed with: CRNA  Anesthesia Plan Comments: (See PAT note from 9/15)         Anesthesia Quick Evaluation

## 2023-11-19 ENCOUNTER — Other Ambulatory Visit: Payer: Self-pay

## 2023-11-19 ENCOUNTER — Ambulatory Visit (HOSPITAL_COMMUNITY): Payer: Self-pay | Admitting: Medical

## 2023-11-19 ENCOUNTER — Observation Stay (HOSPITAL_COMMUNITY)
Admission: RE | Admit: 2023-11-19 | Discharge: 2023-11-21 | Disposition: A | Discharge status: Home / Self Care | Attending: Orthopedic Surgery | Admitting: Orthopedic Surgery

## 2023-11-19 ENCOUNTER — Ambulatory Visit (HOSPITAL_BASED_OUTPATIENT_CLINIC_OR_DEPARTMENT_OTHER): Admitting: Certified Registered Nurse Anesthetist

## 2023-11-19 ENCOUNTER — Encounter (HOSPITAL_COMMUNITY): Payer: Self-pay | Admitting: Orthopedic Surgery

## 2023-11-19 ENCOUNTER — Encounter (HOSPITAL_COMMUNITY)
Admission: RE | Disposition: A | Discharge status: Home / Self Care | Payer: Self-pay | Source: Home / Self Care | Attending: Orthopedic Surgery

## 2023-11-19 DIAGNOSIS — I10 Essential (primary) hypertension: Secondary | ICD-10-CM | POA: Insufficient documentation

## 2023-11-19 DIAGNOSIS — Z7982 Long term (current) use of aspirin: Secondary | ICD-10-CM | POA: Insufficient documentation

## 2023-11-19 DIAGNOSIS — Z79899 Other long term (current) drug therapy: Secondary | ICD-10-CM | POA: Diagnosis not present

## 2023-11-19 DIAGNOSIS — I2510 Atherosclerotic heart disease of native coronary artery without angina pectoris: Secondary | ICD-10-CM

## 2023-11-19 DIAGNOSIS — M1711 Unilateral primary osteoarthritis, right knee: Secondary | ICD-10-CM

## 2023-11-19 DIAGNOSIS — G8918 Other acute postprocedural pain: Secondary | ICD-10-CM | POA: Diagnosis not present

## 2023-11-19 DIAGNOSIS — Z96651 Presence of right artificial knee joint: Principal | ICD-10-CM

## 2023-11-19 HISTORY — PX: TOTAL KNEE ARTHROPLASTY: SHX125

## 2023-11-19 SURGERY — ARTHROPLASTY, KNEE, TOTAL
Anesthesia: Monitor Anesthesia Care | Site: Knee | Laterality: Right

## 2023-11-19 MED ORDER — PROPOFOL 10 MG/ML IV BOLUS
INTRAVENOUS | Status: DC | PRN
  Administered 2023-11-19: 20 mg via INTRAVENOUS

## 2023-11-19 MED ORDER — ONDANSETRON HCL 4 MG PO TABS: 4.0000 mg | ORAL_TABLET | Freq: Three times a day (TID) | ORAL | 1 refills | Status: AC | PRN

## 2023-11-19 MED ORDER — CEFAZOLIN SODIUM-DEXTROSE 2-4 GM/100ML-% IV SOLN
2.0000 g | Freq: Four times a day (QID) | INTRAVENOUS | Status: AC
  Administered 2023-11-19 – 2023-11-20 (×2): 2 g via INTRAVENOUS
  Filled 2023-11-19 (×2): qty 100

## 2023-11-19 MED ORDER — ONDANSETRON HCL 4 MG PO TABS: 4.0000 mg | ORAL_TABLET | Freq: Four times a day (QID) | ORAL | Status: DC | PRN

## 2023-11-19 MED ORDER — VITAMIN D 25 MCG (1000 UNIT) PO TABS
2000.0000 [IU] | ORAL_TABLET | Freq: Every day | ORAL | Status: DC
  Administered 2023-11-20 – 2023-11-21 (×2): 2000 [IU] via ORAL
  Filled 2023-11-19 (×3): qty 2

## 2023-11-19 MED ORDER — FENTANYL CITRATE PF 50 MCG/ML IJ SOSY
100.0000 ug | PREFILLED_SYRINGE | Freq: Once | INTRAMUSCULAR | Status: AC
  Administered 2023-11-19: 50 ug via INTRAVENOUS
  Filled 2023-11-19: qty 2

## 2023-11-19 MED ORDER — DEXAMETHASONE SODIUM PHOSPHATE 4 MG/ML IJ SOLN
INTRAMUSCULAR | Status: DC | PRN
  Administered 2023-11-19: 4 mg via INTRAVENOUS

## 2023-11-19 MED ORDER — MENTHOL 3 MG MT LOZG: 1.0000 | LOZENGE | OROMUCOSAL | Status: DC | PRN

## 2023-11-19 MED ORDER — VITAMIN C 500 MG PO TABS
1000.0000 mg | ORAL_TABLET | Freq: Every day | ORAL | Status: DC
  Administered 2023-11-20 – 2023-11-21 (×2): 1000 mg via ORAL
  Filled 2023-11-19 (×2): qty 2

## 2023-11-19 MED ORDER — PHENOL 1.4 % MT LIQD: 1.0000 | OROMUCOSAL | Status: DC | PRN

## 2023-11-19 MED ORDER — POLYETHYLENE GLYCOL 3350 17 G PO PACK: 17.0000 g | PACK | Freq: Every day | ORAL | Status: DC | PRN

## 2023-11-19 MED ORDER — LACTATED RINGERS IV SOLN
INTRAVENOUS | Status: DC
Start: 2023-11-19 — End: 2023-11-19

## 2023-11-19 MED ORDER — METHOCARBAMOL 500 MG PO TABS: 500.0000 mg | ORAL_TABLET | Freq: Three times a day (TID) | ORAL | 1 refills | Status: AC | PRN

## 2023-11-19 MED ORDER — METOCLOPRAMIDE HCL 5 MG PO TABS: 5.0000 mg | ORAL_TABLET | Freq: Three times a day (TID) | ORAL | Status: DC | PRN

## 2023-11-19 MED ORDER — ONDANSETRON HCL 4 MG/2ML IJ SOLN
INTRAMUSCULAR | Status: DC | PRN
  Administered 2023-11-19: 4 mg via INTRAVENOUS

## 2023-11-19 MED ORDER — SODIUM CHLORIDE 0.9 % IR SOLN
Status: DC | PRN
  Administered 2023-11-19: 1000 mL

## 2023-11-19 MED ORDER — FENTANYL CITRATE PF 50 MCG/ML IJ SOSY: 25.0000 ug | PREFILLED_SYRINGE | INTRAMUSCULAR | Status: DC | PRN

## 2023-11-19 MED ORDER — ADULT MULTIVITAMIN W/MINERALS CH
1.0000 | ORAL_TABLET | Freq: Every day | ORAL | Status: DC
  Administered 2023-11-20 – 2023-11-21 (×2): 1 via ORAL
  Filled 2023-11-19 (×2): qty 1

## 2023-11-19 MED ORDER — FISH OIL OMEGA-3 1000 MG PO CAPS
500.0000 mg | ORAL_CAPSULE | Freq: Every day | ORAL | Status: DC
Start: 2023-11-19 — End: 2023-11-19

## 2023-11-19 MED ORDER — ACETAMINOPHEN 325 MG PO TABS
325.0000 mg | ORAL_TABLET | Freq: Four times a day (QID) | ORAL | Status: DC | PRN
  Administered 2023-11-20: 650 mg via ORAL
  Filled 2023-11-19: qty 2

## 2023-11-19 MED ORDER — HYDROCHLOROTHIAZIDE 12.5 MG PO TABS
12.5000 mg | ORAL_TABLET | Freq: Every day | ORAL | Status: DC
  Administered 2023-11-20 – 2023-11-21 (×2): 12.5 mg via ORAL
  Filled 2023-11-19 (×2): qty 1

## 2023-11-19 MED ORDER — DEXAMETHASONE SODIUM PHOSPHATE 10 MG/ML IJ SOLN
INTRAMUSCULAR | Status: AC
  Filled 2023-11-19: qty 1

## 2023-11-19 MED ORDER — 0.9 % SODIUM CHLORIDE (POUR BTL) OPTIME
TOPICAL | Status: DC | PRN
  Administered 2023-11-19: 1000 mL

## 2023-11-19 MED ORDER — LOSARTAN POTASSIUM 50 MG PO TABS
50.0000 mg | ORAL_TABLET | Freq: Every day | ORAL | Status: DC
  Administered 2023-11-20 – 2023-11-21 (×2): 50 mg via ORAL
  Filled 2023-11-19 (×2): qty 1

## 2023-11-19 MED ORDER — SODIUM CHLORIDE 0.9 % IV SOLN: INTRAVENOUS | Status: AC

## 2023-11-19 MED ORDER — WATER FOR IRRIGATION, STERILE IR SOLN
Status: DC | PRN
  Administered 2023-11-19: 1000 mL

## 2023-11-19 MED ORDER — ASPIRIN 81 MG PO CHEW
81.0000 mg | CHEWABLE_TABLET | Freq: Two times a day (BID) | ORAL | Status: DC
  Administered 2023-11-19 – 2023-11-21 (×4): 81 mg via ORAL
  Filled 2023-11-19 (×4): qty 1

## 2023-11-19 MED ORDER — HYDROMORPHONE HCL 1 MG/ML IJ SOLN
0.5000 mg | INTRAMUSCULAR | Status: DC | PRN
  Administered 2023-11-20 – 2023-11-21 (×2): 1 mg via INTRAVENOUS
  Filled 2023-11-19 (×2): qty 1

## 2023-11-19 MED ORDER — OXYCODONE HCL 5 MG PO TABS: 5.0000 mg | ORAL_TABLET | ORAL | 0 refills | Status: AC | PRN

## 2023-11-19 MED ORDER — METOCLOPRAMIDE HCL 5 MG/ML IJ SOLN: 5.0000 mg | Freq: Three times a day (TID) | INTRAMUSCULAR | Status: DC | PRN

## 2023-11-19 MED ORDER — TRANEXAMIC ACID-NACL 1000-0.7 MG/100ML-% IV SOLN
1000.0000 mg | Freq: Once | INTRAVENOUS | Status: AC
  Administered 2023-11-19: 1000 mg via INTRAVENOUS
  Filled 2023-11-19: qty 100

## 2023-11-19 MED ORDER — PROPOFOL 500 MG/50ML IV EMUL
INTRAVENOUS | Status: DC | PRN
  Administered 2023-11-19: 75 ug/kg/min via INTRAVENOUS

## 2023-11-19 MED ORDER — BUPIVACAINE-EPINEPHRINE 0.25% -1:200000 IJ SOLN
INTRAMUSCULAR | Status: DC | PRN
  Administered 2023-11-19: 30 mL

## 2023-11-19 MED ORDER — BUPIVACAINE-EPINEPHRINE (PF) 0.25% -1:200000 IJ SOLN
INTRAMUSCULAR | Status: AC
Start: 2023-11-19 — End: 2023-11-19
  Filled 2023-11-19: qty 30

## 2023-11-19 MED ORDER — DEXAMETHASONE SODIUM PHOSPHATE 10 MG/ML IJ SOLN
INTRAMUSCULAR | Status: DC | PRN
  Administered 2023-11-19: 10 mg

## 2023-11-19 MED ORDER — ALBUTEROL SULFATE HFA 108 (90 BASE) MCG/ACT IN AERS: 2.0000 | INHALATION_SPRAY | Freq: Four times a day (QID) | RESPIRATORY_TRACT | Status: DC | PRN

## 2023-11-19 MED ORDER — TRANEXAMIC ACID-NACL 1000-0.7 MG/100ML-% IV SOLN
1000.0000 mg | INTRAVENOUS | Status: AC
  Administered 2023-11-19: 1000 mg via INTRAVENOUS
  Filled 2023-11-19: qty 100

## 2023-11-19 MED ORDER — BUPIVACAINE LIPOSOME 1.3 % IJ SUSP
INTRAMUSCULAR | Status: AC
Start: 2023-11-19 — End: 2023-11-19
  Filled 2023-11-19: qty 20

## 2023-11-19 MED ORDER — OXYCODONE HCL 5 MG PO TABS
5.0000 mg | ORAL_TABLET | ORAL | Status: DC | PRN
  Administered 2023-11-19 – 2023-11-20 (×4): 10 mg via ORAL
  Administered 2023-11-20: 5 mg via ORAL
  Administered 2023-11-21 (×2): 10 mg via ORAL
  Filled 2023-11-19: qty 2
  Filled 2023-11-19: qty 1
  Filled 2023-11-19 (×5): qty 2

## 2023-11-19 MED ORDER — ARTIFICIAL TEARS OPHTHALMIC OINT
1.0000 | TOPICAL_OINTMENT | Freq: Two times a day (BID) | OPHTHALMIC | Status: DC
  Filled 2023-11-19: qty 3.5

## 2023-11-19 MED ORDER — MIDAZOLAM HCL 2 MG/2ML IJ SOLN
2.0000 mg | Freq: Once | INTRAMUSCULAR | Status: DC
  Filled 2023-11-19: qty 2

## 2023-11-19 MED ORDER — ACETAMINOPHEN 10 MG/ML IV SOLN: 1000.0000 mg | Freq: Once | INTRAVENOUS | Status: DC | PRN

## 2023-11-19 MED ORDER — METHOCARBAMOL 1000 MG/10ML IJ SOLN: 500.0000 mg | Freq: Four times a day (QID) | INTRAMUSCULAR | Status: DC | PRN

## 2023-11-19 MED ORDER — LOSARTAN POTASSIUM-HCTZ 50-12.5 MG PO TABS: 1.0000 | ORAL_TABLET | Freq: Every day | ORAL | Status: DC

## 2023-11-19 MED ORDER — BUPIVACAINE IN DEXTROSE 0.75-8.25 % IT SOLN
INTRATHECAL | Status: DC | PRN
  Administered 2023-11-19: 1.6 mL via INTRATHECAL

## 2023-11-19 MED ORDER — SODIUM CHLORIDE (PF) 0.9 % IJ SOLN
INTRAMUSCULAR | Status: AC
Start: 2023-11-19 — End: 2023-11-19
  Filled 2023-11-19: qty 30

## 2023-11-19 MED ORDER — BUPIVACAINE LIPOSOME 1.3 % IJ SUSP
INTRAMUSCULAR | Status: DC | PRN
  Administered 2023-11-19: 20 mL

## 2023-11-19 MED ORDER — METHOCARBAMOL 500 MG PO TABS
500.0000 mg | ORAL_TABLET | Freq: Four times a day (QID) | ORAL | Status: DC | PRN
  Administered 2023-11-20 – 2023-11-21 (×4): 500 mg via ORAL
  Filled 2023-11-19 (×4): qty 1

## 2023-11-19 MED ORDER — ASPIRIN 81 MG PO TBEC: 81.0000 mg | DELAYED_RELEASE_TABLET | Freq: Every day | ORAL | Status: DC

## 2023-11-19 MED ORDER — PHENYLEPHRINE HCL-NACL 20-0.9 MG/250ML-% IV SOLN
INTRAVENOUS | Status: DC | PRN
  Administered 2023-11-19: 50 ug/min via INTRAVENOUS

## 2023-11-19 MED ORDER — ORAL CARE MOUTH RINSE
15.0000 mL | Freq: Once | OROMUCOSAL | Status: AC
  Administered 2023-11-19: 15 mL via OROMUCOSAL

## 2023-11-19 MED ORDER — DOCUSATE SODIUM 100 MG PO CAPS
100.0000 mg | ORAL_CAPSULE | Freq: Two times a day (BID) | ORAL | Status: DC
  Administered 2023-11-19 – 2023-11-21 (×4): 100 mg via ORAL
  Filled 2023-11-19 (×4): qty 1

## 2023-11-19 MED ORDER — GLUCOSAMINE 500 MG PO CAPS: ORAL_CAPSULE | Freq: Every day | ORAL | Status: DC

## 2023-11-19 MED ORDER — CHLORHEXIDINE GLUCONATE 0.12 % MT SOLN: 15.0000 mL | Freq: Once | OROMUCOSAL | Status: AC

## 2023-11-19 MED ORDER — ONDANSETRON HCL 4 MG/2ML IJ SOLN: 4.0000 mg | Freq: Four times a day (QID) | INTRAMUSCULAR | Status: DC | PRN

## 2023-11-19 MED ORDER — ROPIVACAINE HCL 5 MG/ML IJ SOLN
INTRAMUSCULAR | Status: DC | PRN
  Administered 2023-11-19: 20 mL via PERINEURAL

## 2023-11-19 MED ORDER — ONDANSETRON HCL 4 MG/2ML IJ SOLN
INTRAMUSCULAR | Status: AC
  Filled 2023-11-19: qty 2

## 2023-11-19 MED ORDER — SODIUM CHLORIDE (PF) 0.9 % IJ SOLN
INTRAMUSCULAR | Status: DC | PRN
  Administered 2023-11-19: 30 mL

## 2023-11-19 MED ORDER — PROPOFOL 1000 MG/100ML IV EMUL
INTRAVENOUS | Status: AC
Start: 2023-11-19 — End: 2023-11-19
  Filled 2023-11-19: qty 100

## 2023-11-19 MED ORDER — ASPIRIN EC 81 MG PO TBEC: 81.0000 mg | DELAYED_RELEASE_TABLET | Freq: Two times a day (BID) | ORAL | 11 refills | Status: AC

## 2023-11-19 MED ORDER — HYDROCORTISONE 1 % EX CREA: 1.0000 | TOPICAL_CREAM | Freq: Every day | CUTANEOUS | Status: DC | PRN

## 2023-11-19 MED ORDER — INFLUENZA VAC SPLIT HIGH-DOSE 0.5 ML IM SUSY
0.5000 mL | PREFILLED_SYRINGE | INTRAMUSCULAR | Status: DC
  Filled 2023-11-19 (×2): qty 0.5

## 2023-11-19 MED ORDER — CEFAZOLIN SODIUM-DEXTROSE 2-4 GM/100ML-% IV SOLN
2.0000 g | INTRAVENOUS | Status: AC
  Administered 2023-11-19: 2 g via INTRAVENOUS
  Filled 2023-11-19: qty 100

## 2023-11-19 SURGICAL SUPPLY — 44 items
ATTUNE PS FEM RT SZ 3 CEM KNEE (Femur) IMPLANT
ATTUNE PSRP INSR SZ3 8 KNEE (Insert) IMPLANT
BAG COUNTER SPONGE SURGICOUNT (BAG) IMPLANT
BAG ZIPLOCK 12X15 (MISCELLANEOUS) ×1 IMPLANT
BASE TIBIAL ROT PLAT SZ 3 KNEE (Knees) IMPLANT
BLADE SAG 18X100X1.27 (BLADE) ×1 IMPLANT
BLADE SAW SGTL 13X75X1.27 (BLADE) ×1 IMPLANT
BNDG ELASTIC 6X10 VLCR STRL LF (GAUZE/BANDAGES/DRESSINGS) ×1 IMPLANT
BNDG GAUZE DERMACEA FLUFF 4 (GAUZE/BANDAGES/DRESSINGS) ×1 IMPLANT
BOWL SMART MIX CTS (DISPOSABLE) ×1 IMPLANT
CEMENT HV SMART SET (Cement) ×2 IMPLANT
COVER SURGICAL LIGHT HANDLE (MISCELLANEOUS) ×1 IMPLANT
CUFF TRNQT CYL 34X4.125X (TOURNIQUET CUFF) ×1 IMPLANT
DRAPE SHEET LG 3/4 BI-LAMINATE (DRAPES) ×1 IMPLANT
DRAPE U-SHAPE 47X51 STRL (DRAPES) ×1 IMPLANT
DRSG ADAPTIC 3X8 NADH LF (GAUZE/BANDAGES/DRESSINGS) ×1 IMPLANT
DURAPREP 26ML APPLICATOR (WOUND CARE) ×1 IMPLANT
ELECT PENCIL ROCKER SW 15FT (MISCELLANEOUS) ×1 IMPLANT
ELECT REM PT RETURN 15FT ADLT (MISCELLANEOUS) ×1 IMPLANT
GAUZE PAD ABD 8X10 STRL (GAUZE/BANDAGES/DRESSINGS) ×1 IMPLANT
GAUZE SPONGE 4X4 12PLY STRL (GAUZE/BANDAGES/DRESSINGS) ×1 IMPLANT
GLOVE BIOGEL PI IND STRL 7.5 (GLOVE) ×1 IMPLANT
GLOVE BIOGEL PI IND STRL 8.5 (GLOVE) ×1 IMPLANT
GLOVE ORTHO TXT STRL SZ7.5 (GLOVE) ×1 IMPLANT
GLOVE SURG ORTHO 8.5 STRL (GLOVE) ×1 IMPLANT
GOWN STRL REUS W/ TWL XL LVL3 (GOWN DISPOSABLE) ×2 IMPLANT
IMMOBILIZER KNEE 20 THIGH 36 (SOFTGOODS) ×1 IMPLANT
KIT TURNOVER KIT A (KITS) ×1 IMPLANT
MANIFOLD NEPTUNE II (INSTRUMENTS) ×1 IMPLANT
NS IRRIG 1000ML POUR BTL (IV SOLUTION) ×1 IMPLANT
PACK TOTAL KNEE CUSTOM (KITS) ×1 IMPLANT
PATELLA MEDIAL ATTUN 35MM KNEE (Knees) IMPLANT
PIN STEINMAN FIXATION KNEE (PIN) IMPLANT
PROTECTOR NERVE ULNAR (MISCELLANEOUS) ×1 IMPLANT
SET HNDPC FAN SPRY TIP SCT (DISPOSABLE) ×1 IMPLANT
STRIP CLOSURE SKIN 1/2X4 (GAUZE/BANDAGES/DRESSINGS) ×2 IMPLANT
SUT MNCRL AB 3-0 PS2 18 (SUTURE) ×1 IMPLANT
SUT VIC AB 0 CT1 36 (SUTURE) ×1 IMPLANT
SUT VIC AB 1 CT1 36 (SUTURE) ×2 IMPLANT
SUT VIC AB 2-0 CT1 TAPERPNT 27 (SUTURE) ×1 IMPLANT
TOWEL GREEN STERILE FF (TOWEL DISPOSABLE) ×1 IMPLANT
TRAY CATH INTERMITTENT SS 16FR (CATHETERS) ×1 IMPLANT
WATER STERILE IRR 1000ML POUR (IV SOLUTION) ×2 IMPLANT
YANKAUER SUCT BULB TIP NO VENT (SUCTIONS) ×1 IMPLANT

## 2023-11-19 NOTE — Brief Op Note (Signed)
 11/19/2023  9:14 PM  PATIENT:  Wendy [name]  83 y.o. female  PRE-OPERATIVE DIAGNOSIS:  Primary osteoarthritis of right knee  POST-OPERATIVE DIAGNOSIS:  Same  PROCEDURE:  Procedure(s): ARTHROPLASTY, KNEE, TOTAL (Right) DePuy Attune  SURGEON:  Surgeons and Role:    DEWAINE Kay Kemps, MD - Primary  PHYSICIAN ASSISTANT:   ASSISTANTS: Ivin Pesa, RNFA   ANESTHESIA:   regional and spinal  EBL:  10 mL   BLOOD ADMINISTERED:none  DRAINS: none   LOCAL MEDICATIONS USED:  MARCAINE      SPECIMEN:  No Specimen  DISPOSITION OF SPECIMEN:  N/A  COUNTS:  YES  TOURNIQUET:   Total Tourniquet Time Documented: Thigh (Right) - 98 minutes Total: Thigh (Right) - 98 minutes   DICTATION: .Other Dictation: Dictation Number [id number]  PLAN OF CARE: Admit for overnight observation  PATIENT DISPOSITION:  PACU - hemodynamically stable.   Delay start of Pharmacological VTE agent (>24hrs) due to surgical blood loss or risk of bleeding: no

## 2023-11-19 NOTE — Progress Notes (Signed)
 Orthopedic Tech Progress Note Patient Details:  Wendy [name] 08/18/1940 [id number] CPM will be removed at 2230 CPM Right Knee CPM Right Knee: On Right Knee Flexion (Degrees): 90 Right Knee Extension (Degrees): 0  Post Interventions Patient Tolerated: Well Ortho Devices Type of Ortho Device: Bone foam zero knee Ortho Device/Splint Location: Right knee Ortho Device/Splint Interventions: Application   Post Interventions Patient Tolerated: Well  [name] 11/19/2023, 7:01 PM

## 2023-11-19 NOTE — Anesthesia Procedure Notes (Signed)
 Procedure Name: MAC Date/Time: 11/19/2023 3:49 PM  Performed by: Judythe Tanda Aran, CRNAPre-anesthesia Checklist: Patient identified, Emergency Drugs available, Suction available and Patient being monitored Patient Re-evaluated:Patient Re-evaluated prior to induction Oxygen Delivery Method: Simple face mask

## 2023-11-19 NOTE — Transfer of Care (Signed)
 Immediate Anesthesia Transfer of Care Note  Patient: Wendy [name]  Procedure(s) Performed: ARTHROPLASTY, KNEE, TOTAL (Right: Knee)  Patient Location: PACU  Anesthesia Type:Spinal  Level of Consciousness: awake and patient cooperative  Airway & Oxygen Therapy: Patient Spontanous Breathing  Post-op Assessment: Report [name] to RN and Post -op Vital signs reviewed and stable  Post vital signs: Reviewed and stable  Last Vitals:  Vitals Value Taken Time  BP    Temp    Pulse    Resp    SpO2      Last Pain:  Vitals:   11/19/23 1316  TempSrc:   PainSc: 0-No pain         Complications: No notable events documented.

## 2023-11-19 NOTE — Discharge Instructions (Signed)
 Ice to the knee constantly.  Keep the incision covered and clean and dry for one week, then ok to get it wet in the shower.  Do exercise as instructed every hour, please to prevent stiffness.    DO NOT prop anything under the knee, it will make your knee stiff.  Prop under the ankle to encourage your knee to go straight.   Use the walker while you are up and around for balance.  Wear your support stockings 24/7 to prevent blood clots and take baby aspirin  twice daily for 30 days also to prevent blood clots  Follow up with Dr Kay in two weeks in the office, call [phone] for appt  Please call Dr Kay (cell) [phone] with any questions or concerns  INSTRUCTIONS AFTER JOINT REPLACEMENT   Remove items at home which could result in a fall. This includes throw rugs or furniture in walking pathways ICE to the affected joint every three hours while awake for 30 minutes at a time, for at least the first 3-5 days, and then as needed for pain and swelling.  Continue to use ice for pain and swelling. You may notice swelling that will progress down to the foot and ankle.  This is normal after surgery.  Elevate your leg when you are not up walking on it.   Continue to use the breathing machine you got in the hospital (incentive spirometer) which will help keep your temperature down.  It is common for your temperature to cycle up and down following surgery, especially at night when you are not up moving around and exerting yourself.  The breathing machine keeps your lungs expanded and your temperature down.   DIET:  As you were doing prior to hospitalization, we recommend a well-balanced diet.  DRESSING / WOUND CARE / SHOWERING  Leave the Aquacel bandage on the knee for one week. Ok to shower with that on the knee(waterproof) After one week, then remove that bandage and leave it open to air  ACTIVITY  Increase activity slowly as tolerated, but follow the weight bearing instructions below.    No driving for 6 weeks or until further direction given by your physician.  You cannot drive while taking narcotics.  No lifting or carrying greater than 10 lbs. until further directed by your surgeon. Avoid periods of inactivity such as sitting longer than an hour when not asleep. This helps prevent blood clots.  You may return to work once you are authorized by your doctor.     WEIGHT BEARING   Weight bearing as tolerated with assist device (walker, cane, etc) as directed, use it as long as suggested by your surgeon or therapist, typically at least 4-6 weeks.   EXERCISES  Results after joint replacement surgery are often greatly improved when you follow the exercise, range of motion and muscle strengthening exercises prescribed by your doctor. Safety measures are also important to protect the joint from further injury. Any time any of these exercises cause you to have increased pain or swelling, decrease what you are doing until you are comfortable again and then slowly increase them. If you have problems or questions, call your caregiver or physical therapist for advice.   Rehabilitation is important following a joint replacement. After just a few days of immobilization, the muscles of the leg can become weakened and shrink (atrophy).  These exercises are designed to build up the tone and strength of the thigh and leg muscles and to improve motion. Often  times heat used for twenty to thirty minutes before working out will loosen up your tissues and help with improving the range of motion but do not use heat for the first two weeks following surgery (sometimes heat can increase post-operative swelling).   These exercises can be done on a training (exercise) mat, on the floor, on a table or on a bed. Use whatever works the best and is most comfortable for you.    Use music or television while you are exercising so that the exercises are a pleasant break in your day. This will make your life  better with the exercises acting as a break in your routine that you can look forward to.   Perform all exercises about fifteen times, three times per day or as directed.  You should exercise both the operative leg and the other leg as well.  Exercises include:   Quad Sets - Tighten up the muscle on the front of the thigh (Quad) and hold for 5-10 seconds.   Straight Leg Raises - With your knee straight (if you were given a brace, keep it on), lift the leg to 60 degrees, hold for 3 seconds, and slowly lower the leg.  Perform this exercise against resistance later as your leg gets stronger.  Leg Slides: Lying on your back, slowly slide your foot toward your buttocks, bending your knee up off the floor (only go as far as is comfortable). Then slowly slide your foot back down until your leg is flat on the floor again.  Angel Wings: Lying on your back spread your legs to the side as far apart as you can without causing discomfort.  Hamstring Strength:  Lying on your back, push your heel against the floor with your leg straight by tightening up the muscles of your buttocks.  Repeat, but this time bend your knee to a comfortable angle, and push your heel against the floor.  You may put a pillow under the heel to make it more comfortable if necessary.   A rehabilitation program following joint replacement surgery can speed recovery and prevent re-injury in the future due to weakened muscles. Contact your doctor or a physical therapist for more information on knee rehabilitation.    CONSTIPATION  Constipation is defined medically as fewer than three stools per week and severe constipation as less than one stool per week.  Even if you have a regular bowel pattern at home, your normal regimen is likely to be disrupted due to multiple reasons following surgery.  Combination of anesthesia, postoperative narcotics, change in appetite and fluid intake all can affect your bowels.   YOU MUST use at least one of the  following options; they are listed in order of increasing strength to get the job done.  They are all available over the counter, and you may need to use some, POSSIBLY even all of these options:    Drink plenty of fluids (prune juice may be helpful) and high fiber foods Colace 100 mg by mouth twice a day  Senokot for constipation as directed and as needed Dulcolax (bisacodyl), take with full glass of water   Miralax  (polyethylene glycol) once or twice a day as needed.  If you have tried all these things and are unable to have a bowel movement in the first 3-4 days after surgery call either your surgeon or your primary doctor.    If you experience loose stools or diarrhea, hold the medications until you stool forms back up.  If  your symptoms do not get better within 1 week or if they get worse, check with your doctor.  If you experience the worst abdominal pain ever or develop nausea or vomiting, please contact the office immediately for further recommendations for treatment.   ITCHING:  If you experience itching with your medications, try taking only a single pain pill, or even half a pain pill at a time.  You can also use Benadryl over the counter for itching or also to help with sleep.   TED HOSE STOCKINGS:  Use stockings on both legs until for at least 2 weeks or as directed by physician office. They may be removed at night for sleeping.  MEDICATIONS:  See your medication summary on the "After Visit Summary" that nursing will review with you.  You may have some home medications which will be placed on hold until you complete the course of blood thinner medication.  It is important for you to complete the blood thinner medication as prescribed.  PRECAUTIONS:  If you experience chest pain or shortness of breath - call 911 immediately for transfer to the hospital emergency department.   If you develop a fever greater that 101 F, purulent drainage from wound, increased redness or drainage from  wound, foul odor from the wound/dressing, or calf pain - CONTACT YOUR SURGEON.                                                   FOLLOW-UP APPOINTMENTS:  If you do not already have a post-op appointment, please call the office for an appointment to be seen by your surgeon.  Guidelines for how soon to be seen are listed in your "After Visit Summary", but are typically between 1-4 weeks after surgery.  OTHER INSTRUCTIONS:   Knee Replacement:  Do not place pillow under knee, focus on keeping the knee straight while resting. CPM instructions: 0-90 degrees, 2 hours in the morning, 2 hours in the afternoon, and 2 hours in the evening. Place foam block, curve side up under heel at all times except when in CPM or when walking.  DO NOT modify, tear, cut, or change the foam block in any way.  POST-OPERATIVE OPIOID TAPER INSTRUCTIONS: It is important to wean off of your opioid medication as soon as possible. If you do not need pain medication after your surgery it is ok to stop day one. Opioids include: Codeine, Hydrocodone(Norco, Vicodin), Oxycodone (Percocet, oxycontin ) and hydromorphone  amongst others.  Long term and even short term use of opiods can cause: Increased pain response Dependence Constipation Depression Respiratory depression And more.  Withdrawal symptoms can include Flu like symptoms Nausea, vomiting And more Techniques to manage these symptoms Hydrate well Eat regular healthy meals Stay active Use relaxation techniques(deep breathing, meditating, yoga) Do Not substitute Alcohol to help with tapering If you have been on opioids for less than two weeks and do not have pain than it is ok to stop all together.  Plan to wean off of opioids This plan should start within one week post op of your joint replacement. Maintain the same interval or time between taking each dose and first decrease the dose.  Cut the total daily intake of opioids by one tablet each day Next start to  increase the time between doses. The last dose that should be eliminated is the evening dose.  MAKE SURE YOU:  Understand these instructions.  Get help right away if you are not doing well or get worse.    Thank you for letting us  be a part of your medical care team.  It is a privilege we respect greatly.  We hope these instructions will help you stay on track for a fast and full recovery!

## 2023-11-19 NOTE — OR Nursing (Incomplete)
 IN AND OUT CATH AT END OF SURGERY. EMPTIED CLEAR YELLOW URINE

## 2023-11-19 NOTE — Interval H&P Note (Signed)
 History and Physical Interval Note:  11/19/2023 3:37 PM  Wendy [name]  has presented today for surgery, with the diagnosis of Primary osteoarthritis of right knee.  The various methods of treatment have been discussed with the patient and family. After consideration of risks, benefits and other options for treatment, the patient has consented to  Procedure(s): ARTHROPLASTY, KNEE, TOTAL (Right) as a surgical intervention.  The patient's history has been reviewed, patient examined, no change in status, stable for surgery.  I have reviewed the patient's chart and labs.  Questions were answered to the patient's satisfaction.     [name]

## 2023-11-19 NOTE — Anesthesia Procedure Notes (Signed)
 Spinal  Patient location during procedure: OR Start time: 11/19/2023 3:54 PM End time: 11/19/2023 3:56 PM Staffing Performed: anesthesiologist  Anesthesiologist: Dorethea Cordella SQUIBB, DO Performed by: Dorethea Cordella SQUIBB, DO Authorized by: Dorethea Cordella SQUIBB, DO   Preanesthetic Checklist Completed: patient identified, IV checked, site marked, risks and benefits discussed, surgical consent, monitors and equipment checked, pre-op evaluation and timeout performed Spinal Block Patient position: sitting Prep: DuraPrep Patient monitoring: heart rate, cardiac monitor, continuous pulse ox and blood pressure Approach: midline Location: L3-4 Injection technique: single-shot Needle Needle type: Pencan  Needle gauge: 24 G Needle length: 10 cm Assessment Events: CSF return Additional Notes Patient identified. Risks/Benefits/Options discussed with patient including but not limited to bleeding, infection, nerve damage, paralysis, failed block, incomplete pain control, headache, blood pressure changes, nausea, vomiting, reactions to medications, itching and postpartum back pain. Confirmed with bedside nurse the patient's most recent platelet count. Confirmed with patient that they are not currently taking any anticoagulation, have any bleeding history or any family history of bleeding disorders. Patient expressed understanding and wished to proceed. All questions were answered. Sterile technique was used throughout the entire procedure. Please see nursing notes for vital signs. Warning signs of high block given to the patient including shortness of breath, tingling/numbness in hands, complete motor block, or any concerning symptoms with instructions to call for help. Patient was given instructions on fall risk and not to get out of bed. All questions and concerns addressed with instructions to call with any issues or inadequate analgesia.

## 2023-11-19 NOTE — Anesthesia Procedure Notes (Signed)
 Anesthesia Regional Block: Adductor canal block   Pre-Anesthetic Checklist: , timeout performed,  Correct Patient, Correct Site, Correct Laterality,  Correct Procedure, Correct Position, site marked,  Risks and benefits discussed,  Surgical consent,  Pre-op evaluation,  At surgeon's request and post-op pain management  Laterality: Right  Prep: Dura Prep       Needles:  Injection technique: Single-shot  Needle Type: Echogenic Stimulator Needle     Needle Length: 10cm  Needle Gauge: 20     Additional Needles:   Procedures:,,,, ultrasound used (permanent image in chart),,    Narrative:  Start time: 11/19/2023 3:20 PM End time: 11/19/2023 3:23 PM Injection made incrementally with aspirations every 5 mL.  Performed by: Personally  Anesthesiologist: Dorethea Cordella SQUIBB, DO  Additional Notes: Patient identified. Risks/Benefits/Options discussed with patient including but not limited to bleeding, infection, nerve damage, failed block, incomplete pain control. Patient expressed understanding and wished to proceed. All questions were answered. Sterile technique was used throughout the entire procedure. Please see nursing notes for vital signs. Aspirated in 5cc intervals with injection for negative confirmation. Patient was given instructions on fall risk and not to get out of bed. All questions and concerns addressed with instructions to call with any issues or inadequate analgesia.

## 2023-11-20 DIAGNOSIS — I10 Essential (primary) hypertension: Secondary | ICD-10-CM | POA: Diagnosis not present

## 2023-11-20 DIAGNOSIS — Z79899 Other long term (current) drug therapy: Secondary | ICD-10-CM | POA: Diagnosis not present

## 2023-11-20 DIAGNOSIS — I2510 Atherosclerotic heart disease of native coronary artery without angina pectoris: Secondary | ICD-10-CM | POA: Diagnosis not present

## 2023-11-20 DIAGNOSIS — Z7982 Long term (current) use of aspirin: Secondary | ICD-10-CM | POA: Diagnosis not present

## 2023-11-20 DIAGNOSIS — M1711 Unilateral primary osteoarthritis, right knee: Secondary | ICD-10-CM | POA: Diagnosis not present

## 2023-11-20 LAB — BASIC METABOLIC PANEL WITH GFR
Anion gap: 12 (ref 5–15)
BUN: 20 mg/dL (ref 8–23)
CO2: 22 mmol/L (ref 22–32)
Calcium: 9.1 mg/dL (ref 8.9–10.3)
Chloride: 103 mmol/L (ref 98–111)
Creatinine, Ser: 0.98 mg/dL (ref 0.44–1.00)
GFR, Estimated: 58 mL/min — ABNORMAL LOW (ref >60)
Glucose, Bld: 153 mg/dL — ABNORMAL HIGH (ref 70–99)
Potassium: 4.3 mmol/L (ref 3.5–5.1)
Sodium: 137 mmol/L (ref 135–145)

## 2023-11-20 LAB — CBC
HCT: 34 % — ABNORMAL LOW (ref 36.0–46.0)
Hemoglobin: 11.2 g/dL — ABNORMAL LOW (ref 12.0–15.0)
MCH: 29.9 pg (ref 26.0–34.0)
MCHC: 32.9 g/dL (ref 30.0–36.0)
MCV: 90.9 fL (ref 80.0–100.0)
Platelets: 165 K/uL (ref 150–400)
RBC: 3.74 MIL/uL — ABNORMAL LOW (ref 3.87–5.11)
RDW: 12.7 % (ref 11.5–15.5)
WBC: 5.6 K/uL (ref 4.0–10.5)
nRBC: 0 % (ref 0.0–0.2)

## 2023-11-20 NOTE — Progress Notes (Signed)
    Subjective:  Patient seen in rounds for Dr. Kay.  Patient reports pain as mild to moderate.  Denies N/V/CP/SOB/Abd pain. She denies tingling or numbness in LE bilaterally.  Husband at bedside.  She reports she is having some pain in her knee this morning.  Plans for up with therapy today.  Voiding without difficulty.   Objective:   VITALS:   Vitals:   11/19/23 2320 11/20/23 0236 11/20/23 0736 11/20/23 0927  BP: 138/63 122/66 135/69 128/75  Pulse: (!) 51 (!) 53 (!) 49 (!) 51  Resp: 15 16 18 16   Temp: (!) 97.4 F (36.3 C) 98 F (36.7 C) (!) 97.1 F (36.2 C) 97.9 F (36.6 C)  TempSrc: Oral   Oral  SpO2: 95% 98% 99% 96%  Weight:      Height:        NAD Neurologically intact ABD soft Neurovascular intact Sensation intact distally Intact pulses distally Dorsiflexion/Plantar flexion intact Incision: dressing C/D/I No cellulitis present Compartment soft Bulky dressing removed, placed Aquacel this morning.   Lab Results  Component Value Date   WBC 5.6 11/20/2023   HGB 11.2 (L) 11/20/2023   HCT 34.0 (L) 11/20/2023   MCV 90.9 11/20/2023   PLT 165 11/20/2023   BMET    Component Value Date/Time   NA 137 11/20/2023 0324   NA 140 09/20/2018 1421   K 4.3 11/20/2023 0324   CL 103 11/20/2023 0324   CO2 22 11/20/2023 0324   GLUCOSE 153 (H) 11/20/2023 0324   BUN 20 11/20/2023 0324   BUN 14 09/20/2018 1421   CREATININE 0.98 11/20/2023 0324   CREATININE 0.88 01/18/2017 1257   CALCIUM 9.1 11/20/2023 0324   GFRNONAA 58 (L) 11/20/2023 0324   GFRNONAA 64 01/18/2017 1257     Assessment/Plan: 1 Day Post-Op   Principal Problem:   Status post total knee replacement, right   WBAT with walker DVT ppx: Aspirin , SCDs, TEDS PO pain control PT/OT: Has not seen yet. To come by today.  Dispo:  -D/c home once cleared with PT.    Wendy [name] Potters 11/20/2023, 10:40 AM   [facility]  [place] [address], [address], [place], [place] [address] Phone:  [phone] [email or web address] Facebook  [employer]

## 2023-11-20 NOTE — Progress Notes (Signed)
 Orthopedic Tech Progress Note Patient Details:  Wendy [name] [birth year] [id number]  CPM Right Knee CPM Right Knee: On Right Knee Flexion (Degrees): 60 Right Knee Extension (Degrees): 0 Additional Comments: Begin 1330  Post Interventions Patient Tolerated: Well  [name] 11/20/2023, 3:45 PM

## 2023-11-20 NOTE — Op Note (Unsigned)
 Wendy [name], BUCHNER MEDICAL RECORD NO: [id number] ACCOUNT NO: [id number] DATE OF BIRTH: 07-28-1940 FACILITY: [facility] LOCATION: [id number] PHYSICIAN: Elspeth SAUNDERS. Kay, MD  Operative Report   DATE OF PROCEDURE: 11/19/2023   PREOPERATIVE DIAGNOSIS: Right knee end-stage arthritis.  POSTOPERATIVE DIAGNOSIS: Right knee end-stage arthritis.  PROCEDURE PERFORMED: Right total knee arthroplasty using DePuy Attune prosthesis.  ATTENDING SURGEON: Elspeth SAUNDERS. Kay, MD  ASSISTANT: Camillo Pesa, RNFA  ANESTHESIA: Regional anesthesia plus spinal anesthesia was utilized.  ESTIMATED BLOOD LOSS: Minimal.  FLUID REPLACEMENT: 1000 mL of crystalloid.  COUNTS: Instrument count was correct.  COMPLICATIONS: None.  ANTIBIOTICS: Perioperative antibiotics were given.  INDICATIONS: The patient is an 83 year old female with progressively worsening right knee pain due to end-stage osteoarthritis, bone-on-bone. The patient has had a failure of conservative management and desires operative treatment to eliminate pain and  restore function. Informed consent was obtained.  DESCRIPTION OF PROCEDURE: After an adequate level of spinal anesthesia was achieved plus an adductor canal block, the patient was positioned supine on the operating room table. A nonsterile tourniquet was placed on the right proximal thigh. The right leg  was sterilely prepped and draped in the usual manner. A timeout was called, verifying correct patient and correct site. We elevated the leg and exsanguinated with an Esmarch bandage and inflated the tourniquet to 250 mmHg. We then placed the knee in  flexion and performed a longitudinal midline incision with the knee bent with a #10 blade scalpel. Dissection was carried down through subcutaneous tissues. We used a fresh #10 blade scalpel for the medial parapatellar arthrotomy. We divided the lateral  patellofemoral ligaments, everting the patella, and exposing the distal femur, which was devoid of  cartilage. We entered the distal femur with a step-cut drill. We then placed our intramedullary guide and resected 9 mm off the distal femur, set in 5  degrees of valgus. We then sized our femur to a size 3, anterior down, performing anterior, posterior, and chamfer cuts with a 4-in-1 size 3 block. We then removed the ACL, the PCL, and the meniscal tissues. We subluxed the tibia anteriorly. We then cut  the tibia 90 degrees perpendicular to the long axis of the tibia with minimal posterior slope for this posterior cruciate substituting prosthesis, resecting 2 mm off the affected medial side. Once we had our tibial cut done, we used a laminar spreader to  remove posterior femoral condyle osteophytes. I then injected the posterior capsule with a combination of Marcaine , Exparel  and saline. Next, we checked our gaps which were symmetric at 6 mm. We then completed our tibial preparation for the size 3 tibia  with the modular drill and keel punch. We went to the femur and cut the box for the 3 right femur. Once we had our trial femur in place, we drilled our lug holes. We then placed a size 3, 6 mm poly trial onto the tibia and reduced the knee. We had a  nice stable knee throughout a full arc of motion with excellent stability. We then went to the patella, resurfacing from a 22-mm thickness down to a 14-mm thickness with the oscillating saw. We drilled the lug holes for the size 35 patellar button and  trialed with the 35 button in place. We ranged the knee and had excellent patellar tracking with no-touch technique. We removed all trial components, irrigated thoroughly, dried the bone well, and vacuum mixed high-viscosity cement on the back table. We  cemented the components into place, 3 tibia, 3 right femur,  placed a size 3, 6 mm poly in place, placed the knee in extension to compress the cement while it set up and also used a patellar clamp to hold that compressed while the cement set up. Once all  the  cement was cured on the back table, we removed excess cement with a 1/4-inch curved osteotome. We did a final inspection around the knee. I went ahead and selected the real size 3, 8 mm poly and changed out the trial for the poly, reduced the knee  with a nice pop as that medial condyle reduced and excellent stability throughout our full arc of motion, excellent patellar tracking. We injected the anterior capsule before closing with Marcaine , Exparel  and saline. We then closed the parapatellar  arthrotomy with #1 Vicryl suture followed by 0 and 2-0 layer for the subcutaneous closure and 4-0 running Monocryl for skin. Steri-Strips were applied followed by a sterile dressing. The patient tolerated the surgery well.    SUJ D: 11/19/2023 9:19:22 pm T: 11/20/2023 12:06:00 am  JOB: [id number]/ [id number]

## 2023-11-20 NOTE — Evaluation (Signed)
 Physical Therapy Evaluation Patient Details Name: Wendy [name] MRN: [id number] DOB: 09-19-1940 Today's Date: 11/20/2023  History of Present Illness  83 yo female s/P RTKA on 11/19/23.PMH: HTN, GERD, lumbar stenosis, MVP,  Clinical Impression  The patient is ready to mobilize, reports pain 8/10, has been premedicated.  Patient ambulated x ~ 62' using RW.   Patient should progress to Dc home with family support, Most like tomorrow.  Pt admitted with above diagnosis.  Pt currently with functional limitations due to the deficits listed below (see PT Problem List). Pt will benefit from acute skilled PT to increase their independence and safety with mobility to allow discharge.          If plan is discharge home, recommend the following: A little help with walking and/or transfers;A little help with bathing/dressing/bathroom;Assistance with cooking/housework;Assist for transportation;Help with stairs or ramp for entrance   Can travel by private vehicle        Equipment Recommendations Rolling walker (2 wheels)  Recommendations for Other Services       Functional Status Assessment Patient has had a recent decline in their functional status and demonstrates the ability to make significant improvements in function in a reasonable and predictable amount of time.     Precautions / Restrictions Precautions Precautions: Fall;Knee Restrictions Weight Bearing Restrictions Per Provider Order: No      Mobility  Bed Mobility Overal bed mobility: Needs Assistance Bed Mobility: Supine to Sit     Supine to sit: Min assist     General bed mobility comments: use of belt to self  assist    Transfers Overall transfer level: Needs assistance Equipment used: Rolling walker (2 wheels) Transfers: Sit to/from Stand Sit to Stand: Min assist           General transfer comment: cues for hand placement. and R leg position    Ambulation/Gait Ambulation/Gait assistance: Min assist Gait  Distance (Feet): 60 Feet Assistive device: Rolling walker (2 wheels) Gait Pattern/deviations: Step-to pattern, Antalgic Gait velocity: decr     General Gait Details: cues for sequence  Stairs            Wheelchair Mobility     Tilt Bed    Modified Rankin (Stroke Patients Only)       Balance Overall balance assessment: Mild deficits observed, not formally tested                                           Pertinent Vitals/Pain Pain Assessment Pain Assessment: 0-10 Pain Score: 8  Pain Location: right  knee Pain Descriptors / Indicators: Aching, Discomfort Pain Intervention(s): Limited activity within patient's tolerance, Monitored during session, Premedicated before session, Ice applied    Home Living Family/patient expects to be discharged to:: Private residence Living Arrangements: Spouse/significant other Available Help at Discharge: Family;Available 24 hours/day Type of Home: House Home Access: Stairs to enter   [employer] of Steps: small half   Home Layout: Two level;Able to live on main level with bedroom/bathroom Home Equipment: None      Prior Function Prior Level of Function : Independent/Modified Independent;Driving                     Extremity/Trunk Assessment   Upper Extremity Assessment Upper Extremity Assessment: Overall WFL for tasks assessed    Lower Extremity Assessment Lower Extremity Assessment: RLE deficits/detail RLE Deficits / Details:  kne flex to ~ 50, SLR with min assist    Cervical / Trunk Assessment Cervical / Trunk Assessment: Normal  Communication   Communication Communication: No apparent difficulties    Cognition Arousal: Alert Behavior During Therapy: WFL for tasks assessed/performed   PT - Cognitive impairments: No apparent impairments                         Following commands: Intact       Cueing       General Comments      Exercises Total Joint  Exercises Ankle Circles/Pumps: AROM, Both, 10 reps Quad Sets: AROM, Both, 10 reps Heel Slides: AAROM, Right, 5 reps Hip ABduction/ADduction: AAROM, Right, 5 reps Straight Leg Raises: AAROM, Right, 5 reps   Assessment/Plan    PT Assessment Patient needs continued PT services  PT Problem List Decreased strength;Decreased range of motion;Decreased activity tolerance;Decreased mobility;Decreased knowledge of precautions;Pain       PT Treatment Interventions DME instruction;Therapeutic exercise;Gait training;Functional mobility training;Therapeutic activities;Patient/family education    PT Goals (Current goals can be found in the Care Plan section)  Acute Rehab PT Goals Patient Stated Goal: go home PT Goal Formulation: With patient/family Time For Goal Achievement: 11/26/23 Potential to Achieve Goals: Good    Frequency 7X/week     Co-evaluation               AM-PAC PT 6 Clicks Mobility  Outcome Measure Help needed turning from your back to your side while in a flat bed without using bedrails?: A Little Help needed moving from lying on your back to sitting on the side of a flat bed without using bedrails?: A Little Help needed moving to and from a bed to a chair (including a wheelchair)?: A Little Help needed standing up from a chair using your arms (e.g., wheelchair or bedside chair)?: A Little Help needed to walk in hospital room?: A Little Help needed climbing 3-5 steps with a railing? : A Little 6 Click Score: 18    End of Session Equipment Utilized During Treatment: Gait belt Activity Tolerance: Patient tolerated treatment well Patient left: in chair;with call bell/phone within reach;with family/visitor present Nurse Communication: Mobility status PT Visit Diagnosis: Unsteadiness on feet (R26.81);Difficulty in walking, not elsewhere classified (R26.2);Pain Pain - Right/Left: Right Pain - part of body: Knee    Time: 9059-8974 PT Time Calculation (min) (ACUTE  ONLY): 45 min   Charges:   PT Evaluation $PT Eval Low Complexity: 1 Low PT Treatments $Gait Training: 8-22 mins $Therapeutic Exercise: 8-22 mins PT General Charges $$ ACUTE PT VISIT: 1 Visit         Darice Potters PT Acute Rehabilitation Services Office [phone]   Potters Darice Norris 11/20/2023, 3:31 PM

## 2023-11-20 NOTE — Care Management Obs Status (Signed)
 MEDICARE OBSERVATION STATUS NOTIFICATION   Patient Details  Name: Wendy [name] MRN: [id number] Date of Birth: 09-28-1940   Medicare Observation Status Notification Given:  Yes    Sheri ONEIDA Sharps, LCSW 11/20/2023, 12:52 PM

## 2023-11-20 NOTE — Progress Notes (Signed)
 Physical Therapy Treatment Patient Details Name: Wendy [name] MRN: [id number] DOB: [birth year] Today's Date: 11/20/2023   History of Present Illness 83 yo female s/P RTKA on 11/19/23.PMH: HTN, GERD, lumbar stenosis, MVP,    PT Comments   Patient making steady progress with mobility, ambulated x 80' using RW. Patient should  be able to Dc home tomorrow. Patient rated pain 8/10, has been premedicated.  Continue PT     If plan is discharge home, recommend the following: A little help with walking and/or transfers;A little help with bathing/dressing/bathroom;Assistance with cooking/housework;Assist for transportation;Help with stairs or ramp for entrance   Can travel by private vehicle        Equipment Recommendations  Rolling walker (2 wheels) (youth)    Recommendations for Other Services       Precautions / Restrictions Precautions Precautions: Fall;Knee Restrictions Weight Bearing Restrictions Per Provider Order: No     Mobility  Bed Mobility Overal bed mobility: Needs Assistance Bed Mobility: Sit to Supine     Supine to sit: Sit to supine: Min assist   General bed mobility comments: assist right leg    Transfers Overall transfer level: Needs assistance Equipment used: Rolling walker (2 wheels) Transfers: Sit to/from Stand Sit to Stand: Min assist           General transfer comment: cues for hand placement. and R leg position, from toilet and  recliner    Ambulation/Gait Ambulation/Gait assistance: Contact guard assist Gait Distance (Feet): 20 Feet (then 80) Assistive device: Rolling walker (2 wheels) Gait Pattern/deviations: Step-to pattern, Antalgic, Step-through pattern Gait velocity: decr     General Gait Details: improved cadence, swing through   Stairs             Wheelchair Mobility     Tilt Bed    Modified Rankin (Stroke Patients Only)       Balance Overall balance assessment: Mild deficits observed, not formally tested                                           Communication Communication Communication: No apparent difficulties  Cognition Arousal: Alert Behavior During Therapy: WFL for tasks assessed/performed   PT - Cognitive impairments: No apparent impairments                         Following commands: Intact      Cueing    Exercises   General Comments        Pertinent Vitals/Pain Pain Assessment Pain Assessment: 0-10 Pain Score: 8  Pain Location: right  knee Pain Descriptors / Indicators: Aching, Discomfort Pain Intervention(s): Monitored during session, Premedicated before session, Ice applied    Home Living Family/patient expects to be discharged to:: Private residence Living Arrangements: Spouse/significant other Available Help at Discharge: Family;Available 24 hours/day Type of Home: House Home Access: Stairs to enter   [employer] of Steps: small half   Home Layout: Two level;Able to live on main level with bedroom/bathroom Home Equipment: None      Prior Function            PT Goals (current goals can now be found in the care plan section) Acute Rehab PT Goals Patient Stated Goal: go home PT Goal Formulation: With patient/family Time For Goal Achievement: 11/26/23 Potential to Achieve Goals: Good Progress towards PT goals: Progressing toward goals  Frequency    7X/week      PT Plan      Co-evaluation              AM-PAC PT 6 Clicks Mobility   Outcome Measure  Help needed turning from your back to your side while in a flat bed without using bedrails?: A Little Help needed moving from lying on your back to sitting on the side of a flat bed without using bedrails?: A Little Help needed moving to and from a bed to a chair (including a wheelchair)?: A Little Help needed standing up from a chair using your arms (e.g., wheelchair or bedside chair)?: A Little Help needed to walk in hospital room?: A Little Help needed  climbing 3-5 steps with a railing? : A Little 6 Click Score: 18    End of Session Equipment Utilized During Treatment: Gait belt Activity Tolerance: Patient tolerated treatment well Patient left: in bed;with call bell/phone within reach;with family/visitor present Nurse Communication: Mobility status PT Visit Diagnosis: Unsteadiness on feet (R26.81);Difficulty in walking, not elsewhere classified (R26.2);Pain Pain - Right/Left: Right Pain - part of body: Knee     Time: 1430-1500 PT Time Calculation (min) (ACUTE ONLY): 30 min  Charges:    $Gait Training: 23-37 mins $ PT General Charges $$ ACUTE PT VISIT: 1 Visit                     Darice Potters PT Acute Rehabilitation Services Office [phone]    Potters Darice Norris 11/20/2023, 3:37 PM

## 2023-11-20 NOTE — Progress Notes (Signed)
 Orthopedic Tech Progress Note Patient Details:  Wendy [name] 1940-11-17 [id number]  CPM Right Knee CPM Right Knee: Off Right Knee Flexion (Degrees): 60 Right Knee Extension (Degrees): 0 Additional Comments: Begin 1330  Post Interventions Patient Tolerated: Well  [name] 11/20/2023, 5:34 PM

## 2023-11-21 DIAGNOSIS — I10 Essential (primary) hypertension: Secondary | ICD-10-CM | POA: Diagnosis not present

## 2023-11-21 DIAGNOSIS — M1711 Unilateral primary osteoarthritis, right knee: Secondary | ICD-10-CM | POA: Diagnosis not present

## 2023-11-21 DIAGNOSIS — Z79899 Other long term (current) drug therapy: Secondary | ICD-10-CM | POA: Diagnosis not present

## 2023-11-21 DIAGNOSIS — I2510 Atherosclerotic heart disease of native coronary artery without angina pectoris: Secondary | ICD-10-CM | POA: Diagnosis not present

## 2023-11-21 DIAGNOSIS — Z7982 Long term (current) use of aspirin: Secondary | ICD-10-CM | POA: Diagnosis not present

## 2023-11-21 DIAGNOSIS — Z96651 Presence of right artificial knee joint: Secondary | ICD-10-CM | POA: Diagnosis not present

## 2023-11-21 LAB — CBC
HCT: 32.7 % — ABNORMAL LOW (ref 36.0–46.0)
Hemoglobin: 11 g/dL — ABNORMAL LOW (ref 12.0–15.0)
MCH: 30.2 pg (ref 26.0–34.0)
MCHC: 33.6 g/dL (ref 30.0–36.0)
MCV: 89.8 fL (ref 80.0–100.0)
Platelets: 143 K/uL — ABNORMAL LOW (ref 150–400)
RBC: 3.64 MIL/uL — ABNORMAL LOW (ref 3.87–5.11)
RDW: 12.7 % (ref 11.5–15.5)
WBC: 6 K/uL (ref 4.0–10.5)
nRBC: 0 % (ref 0.0–0.2)

## 2023-11-21 NOTE — Progress Notes (Signed)
 Physical Therapy Treatment Patient Details Name: Wendy [name] MRN: [id number] DOB: April 02, 1940 Today's Date: 11/21/2023   History of Present Illness 83 yo female s/P RTKA on 11/19/23.PMH: HTN, GERD, lumbar stenosis, MVP,    PT Comments  Pt supine in bed on arrival this session.  Pt just ambulated to and from bathroom and robaxin  given pre tx.  Reports pain 5/10.  Slow cadence noted during session.  Performed curb training to simulate entry into her home.  Pt required min assistance overall with good family support.      If plan is discharge home, recommend the following: A little help with walking and/or transfers;A little help with bathing/dressing/bathroom;Assistance with cooking/housework;Assist for transportation;Help with stairs or ramp for entrance   Can travel by private vehicle        Equipment Recommendations  Rolling walker (2 wheels) (youth height)    Recommendations for Other Services       Precautions / Restrictions Precautions Precautions: Fall;Knee Restrictions Weight Bearing Restrictions Per Provider Order: Yes RLE Weight Bearing Per Provider Order: Weight bearing as tolerated     Mobility  Bed Mobility Overal bed mobility: Needs Assistance Bed Mobility: Supine to Sit, Sit to Supine     Supine to sit: Min assist Sit to supine: Min assist   General bed mobility comments: Pt required use of gt belt to move RLE out of bed and back into bed.  She was very slow and guarded this session.  Required max VCs for hand placement to use rails and achieve sitting edge of bed.    Transfers Overall transfer level: Needs assistance Equipment used: Rolling walker (2 wheels) Transfers: Sit to/from Stand Sit to Stand: Min assist           General transfer comment: Cues for hand placement to and from seated surface with cues for positioned LLE towards bed to achieve standing.  Minor posterior lean initially.    Ambulation/Gait Ambulation/Gait assistance: Min  assist Gait Distance (Feet): 30 Feet (Pt reports ambulating to bathroom and back pre session.) Assistive device: Rolling walker (2 wheels) Gait Pattern/deviations: Step-to pattern, Antalgic, Trunk flexed Gait velocity: decr     General Gait Details: Cues for sequencing and RW position this session.   Stairs Stairs: Yes Stairs assistance: Min assist Stair Management: No rails, Backwards, Step to pattern, With walker, Forwards Number of Stairs: 2 General stair comments: Performed curb step x 2 to simulate entry into her home.  Pt required assistance and cues for sequencing and RW position.  Performed x1 forwards to ascend and x1 backwards to ascend.   Wheelchair Mobility     Tilt Bed    Modified Rankin (Stroke Patients Only)       Balance Overall balance assessment: Mild deficits observed, not formally tested                                          Communication Communication Communication: No apparent difficulties  Cognition Arousal: Alert Behavior During Therapy: WFL for tasks assessed/performed   PT - Cognitive impairments: No apparent impairments                         Following commands: Intact      Cueing    Exercises Total Joint Exercises Ankle Circles/Pumps: AROM, Both, 10 reps Quad Sets: AROM, Right, 10 reps, Supine Heel Slides: Right, AAROM, 10  reps, Supine Hip ABduction/ADduction: AAROM, 10 reps, Supine, Right Straight Leg Raises: AAROM, 10 reps, Supine, Right    General Comments        Pertinent Vitals/Pain Pain Assessment Pain Assessment: 0-10 Pain Score: 5  Pain Location: right  knee Pain Descriptors / Indicators: Aching, Discomfort Pain Intervention(s): Repositioned, Premedicated before session    Home Living                          Prior Function            PT Goals (current goals can now be found in the care plan section) Acute Rehab PT Goals Patient Stated Goal: go home Potential to  Achieve Goals: Good Progress towards PT goals: Progressing toward goals    Frequency    7X/week      PT Plan      Co-evaluation              AM-PAC PT 6 Clicks Mobility   Outcome Measure  Help needed turning from your back to your side while in a flat bed without using bedrails?: A Little Help needed moving from lying on your back to sitting on the side of a flat bed without using bedrails?: A Little Help needed moving to and from a bed to a chair (including a wheelchair)?: A Little Help needed standing up from a chair using your arms (e.g., wheelchair or bedside chair)?: A Little Help needed to walk in hospital room?: A Little Help needed climbing 3-5 steps with a railing? : A Little 6 Click Score: 18    End of Session Equipment Utilized During Treatment: Gait belt Activity Tolerance: Patient tolerated treatment well Patient left: in bed;with call bell/phone within reach;with family/visitor present Nurse Communication: Mobility status PT Visit Diagnosis: Unsteadiness on feet (R26.81);Difficulty in walking, not elsewhere classified (R26.2);Pain Pain - Right/Left: Right Pain - part of body: Knee     Time: 8970-8897 PT Time Calculation (min) (ACUTE ONLY): 33 min  Charges:    $Gait Training: 8-22 mins $Therapeutic Exercise: 8-22 mins PT General Charges $$ ACUTE PT VISIT: 1 Visit                     Wendy [name] , PTA Acute Rehabilitation Services Office [phone]    Anushri Casalino JINNY Gosling 11/21/2023, 11:14 AM

## 2023-11-21 NOTE — Discharge Summary (Signed)
 In most cases prophylactic antibiotics for Dental procdeures after total joint surgery are not necessary.  Exceptions are as follows:  1. History of prior total joint infection  2. Severely immunocompromised (Organ Transplant, cancer chemotherapy, Rheumatoid biologic meds such as Humera)  3. Poorly controlled diabetes (A1C &gt; 8.0, blood glucose over 200)  If you have one of these conditions, contact your surgeon for an antibiotic prescription, prior to your dental procedure. Orthopedic Discharge Summary        Physician Discharge Summary  Patient ID: Wendy [name] MRN: [id number] DOB/AGE: 1940/06/02 83 y.o.  Admit date: 11/19/2023 Discharge date: 11/21/2023   Procedures:  Procedure(s) (LRB): ARTHROPLASTY, KNEE, TOTAL (Right)  Attending Physician:  Dr. Elspeth Her  Admission Diagnoses:   right knee end stage OA  Discharge Diagnoses:  same   Past Medical History:  Diagnosis Date   Arthritis    hands, neck lower back   Bronchitis    Cataract    extracted   Complication of anesthesia    BP drops   GERD (gastroesophageal reflux disease)    prior med   Hypertension    Leukopenia    Lumbar stenosis    MVP (mitral valve prolapse)     PCP: Sheldon Netter, PA   Discharged Condition: good  Hospital Course:  Patient underwent the above stated procedure on 11/19/2023. Patient tolerated the procedure well and brought to the recovery room in good condition and subsequently to the floor. Patient had an uncomplicated hospital course and was stable for discharge.   Disposition: Discharge disposition: 01-Home or Self Care      with follow up in 2 weeks    Follow-up Information     Her Kemps, MD. Call in 2 week(s).   Specialty: Orthopedic Surgery Why: please call the office for a follow up appt in two weeks with Dr Her   [phone] Contact information: [address] [place] [address] [phone]                  Dental Antibiotics:  In most cases prophylactic antibiotics for Dental procdeures after total joint surgery are not necessary.  Exceptions are as follows:  1. History of prior total joint infection  2. Severely immunocompromised (Organ Transplant, cancer chemotherapy, Rheumatoid biologic meds such as Humera)  3. Poorly controlled diabetes (A1C &gt; 8.0, blood glucose over 200)  If you have one of these conditions, contact your surgeon for an antibiotic prescription, prior to your dental procedure.  Discharge Instructions     Call MD / Call 911   Complete by: As directed    If you experience chest pain or shortness of breath, CALL 911 and be transported to the hospital emergency room.  If you develope a fever above 101 F, pus (white drainage) or increased drainage or redness at the wound, or calf pain, call your surgeon's office.   Constipation Prevention   Complete by: As directed    Drink plenty of fluids.  Prune juice may be helpful.  You may use a stool softener, such as Colace (over the counter) 100 mg twice a day.  Use MiraLax  (over the counter) for constipation as needed.   Diet - low sodium heart healthy   Complete by: As directed    Increase activity slowly as tolerated   Complete by: As directed    Post-operative opioid taper instructions:   Complete by: As directed    POST-OPERATIVE OPIOID TAPER INSTRUCTIONS: It is important to wean  off of your opioid medication as soon as possible. If you do not need pain medication after your surgery it is ok to stop day one. Opioids include: Codeine, Hydrocodone(Norco, Vicodin), Oxycodone (Percocet, oxycontin ) and hydromorphone  amongst others.  Long term and even short term use of opiods can cause: Increased pain response Dependence Constipation Depression Respiratory depression And more.  Withdrawal symptoms can include Flu like symptoms Nausea, vomiting And more Techniques to manage these symptoms Hydrate well Eat  regular healthy meals Stay active Use relaxation techniques(deep breathing, meditating, yoga) Do Not substitute Alcohol to help with tapering If you have been on opioids for less than two weeks and do not have pain than it is ok to stop all together.  Plan to wean off of opioids This plan should start within one week post op of your joint replacement. Maintain the same interval or time between taking each dose and first decrease the dose.  Cut the total daily intake of opioids by one tablet each day Next start to increase the time between doses. The last dose that should be eliminated is the evening dose.          Allergies as of 11/21/2023   No Known Allergies      Medication List     TAKE these medications    albuterol  108 (90 Base) MCG/ACT inhaler Commonly known as: VENTOLIN  HFA Inhale 2 puffs into the lungs every 6 (six) hours as needed (Bronchitis).   aspirin  EC 81 MG tablet Take 1 tablet (81 mg total) by mouth 2 (two) times daily. What changed: when to take this   FISH OIL  OMEGA-3 PO Take 500 mg by mouth daily.   GLUCOSAMINE PO Take 2 tablets by mouth daily.   hydrocortisone  cream 1 % Apply 1 Application topically daily as needed for itching.   losartan -hydrochlorothiazide  50-12.5 MG tablet Commonly known as: HYZAAR Take 1 tablet by mouth daily.   methocarbamol  500 MG tablet Commonly known as: ROBAXIN  Take 1 tablet (500 mg total) by mouth every 8 (eight) hours as needed for muscle spasms.   multivitamin tablet Take 1 tablet by mouth daily.   ondansetron  4 MG tablet Commonly known as: Zofran  Take 1 tablet (4 mg total) by mouth every 8 (eight) hours as needed for nausea, vomiting or refractory nausea / vomiting.   oxyCODONE  5 MG immediate release tablet Commonly known as: Roxicodone  Take 1 tablet (5 mg total) by mouth every 4 (four) hours as needed for severe pain (pain score 7-10).   Systane 0.4-0.3 % Gel ophthalmic gel Generic drug: Polyethyl  Glycol-Propyl Glycol Place 1 Application into both eyes 2 (two) times daily.   vitamin C  1000 MG tablet Take 1,000 mg by mouth daily.   Vitamin D  50 MCG (2000 UT) Caps Take 2,000 Units by mouth daily.               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

## 2023-11-21 NOTE — Progress Notes (Signed)
 Orthopedics Progress Note  Subjective: Patient comfortable this AM. She is a little discouraged with her slow progress with PT to this point  Objective:  Vitals:   11/21/23 0031 11/21/23 0454  BP: 134/76 (!) 146/69  Pulse: 60 64  Resp: 19 18  Temp: 98.3 F (36.8 C) 98.9 F (37.2 C)  SpO2: 94% 96%    General: Awake and alert  Musculoskeletal: Knee dressing CDI. No pain with ankle pumps and with quad set Neurovascularly intact  Lab Results  Component Value Date   WBC 6.0 11/21/2023   HGB 11.0 (L) 11/21/2023   HCT 32.7 (L) 11/21/2023   MCV 89.8 11/21/2023   PLT 143 (L) 11/21/2023       Component Value Date/Time   NA 137 11/20/2023 0324   NA 140 09/20/2018 1421   K 4.3 11/20/2023 0324   CL 103 11/20/2023 0324   CO2 22 11/20/2023 0324   GLUCOSE 153 (H) 11/20/2023 0324   BUN 20 11/20/2023 0324   BUN 14 09/20/2018 1421   CREATININE 0.98 11/20/2023 0324   CREATININE 0.88 01/18/2017 1257   CALCIUM 9.1 11/20/2023 0324   GFRNONAA 58 (L) 11/20/2023 0324   GFRNONAA 64 01/18/2017 1257   GFRAA 62 09/20/2018 1421   GFRAA 74 01/18/2017 1257    No results found for: INR, PROTIME  Assessment/Plan: POD #2 s/p Procedure(s): ARTHROPLASTY, KNEE, TOTAL Stable overnight. Continue therapy Discharge home later today if she clears PT  Elspeth SAUNDERS. Kay, MD 11/21/2023 7:43 AM

## 2023-11-21 NOTE — Anesthesia Postprocedure Evaluation (Signed)
 Anesthesia Post Note  Patient: Wendy [name]  Procedure(s) Performed: ARTHROPLASTY, KNEE, TOTAL (Right: Knee)     Patient location during evaluation: PACU Anesthesia Type: Regional, MAC and Spinal Level of consciousness: awake and alert Pain management: pain level controlled Vital Signs Assessment: post-procedure vital signs reviewed and stable Respiratory status: spontaneous breathing, nonlabored ventilation, respiratory function stable and patient connected to nasal cannula oxygen Cardiovascular status: stable and blood pressure returned to baseline Postop Assessment: no apparent nausea or vomiting Anesthetic complications: no   No notable events documented.  Last Vitals:  Vitals:   11/21/23 0031 11/21/23 0454  BP: 134/76 (!) 146/69  Pulse: 60 64  Resp: 19 18  Temp: 36.8 C 37.2 C  SpO2: 94% 96%    Last Pain:  Vitals:   11/21/23 1008  TempSrc:   PainSc: 6                  Janelis Stelzer P Tiburcio Linder

## 2023-11-21 NOTE — Progress Notes (Signed)
 Physical Therapy Treatment Patient Details Name: Wendy [name] MRN: [id number] DOB: Sep 08, 1940 Today's Date: 11/21/2023   History of Present Illness 83 yo female s/P RTKA on 11/19/23.PMH: HTN, GERD, lumbar stenosis, MVP,    PT Comments  Pt progressing well.  She is more alert this pm as robaxin  wears off.  Pain 4/10.  Gt speed improved with progression to step through pattern.  Informed nursing she is ready for d/c home this pm.      If plan is discharge home, recommend the following: A little help with walking and/or transfers;A little help with bathing/dressing/bathroom;Assistance with cooking/housework;Assist for transportation;Help with stairs or ramp for entrance   Can travel by private vehicle        Equipment Recommendations  Rolling walker (2 wheels) (youth height)    Recommendations for Other Services       Precautions / Restrictions Precautions Precautions: Fall;Knee Restrictions Weight Bearing Restrictions Per Provider Order: Yes RLE Weight Bearing Per Provider Order: Weight bearing as tolerated     Mobility  Bed Mobility Overal bed mobility: Needs Assistance Bed Mobility: Supine to Sit     Supine to sit: Min assist   General bed mobility comments: Required tactile cues for technique.  Use of rail to raise into sitting.    Transfers Overall transfer level: Needs assistance Equipment used: Rolling walker (2 wheels) Transfers: Sit to/from Stand Sit to Stand: Supervision           General transfer comment: Cues for hand placement to and from seated surface.    Ambulation/Gait Ambulation/Gait assistance: Contact guard assist Gait Distance (Feet): 80 Feet Assistive device: Rolling walker (2 wheels) Gait Pattern/deviations: Antalgic, Trunk flexed, Step-through pattern Gait velocity: decr     General Gait Details: Pt with improved gt speed and progression to step through pattern this session.   Stairs    Wheelchair Mobility     Tilt Bed     Modified Rankin (Stroke Patients Only)       Balance Overall balance assessment: Mild deficits observed, not formally tested                                          Communication Communication Communication: No apparent difficulties  Cognition Arousal: Alert Behavior During Therapy: WFL for tasks assessed/performed   PT - Cognitive impairments: No apparent impairments                         Following commands: Intact      Cueing    Exercises Total Joint Exercises Ankle Circles/Pumps: AROM, Both, 10 reps Quad Sets: AROM, Right, 10 reps, Supine Heel Slides: Right, AAROM, 10 reps, Supine Hip ABduction/ADduction: 10 reps, Supine, Right, AROM Straight Leg Raises: AAROM, 10 reps, Supine, Right Goniometric ROM: 6-71 R knee    General Comments        Pertinent Vitals/Pain Pain Assessment Pain Assessment: 0-10 Pain Score: 4  Pain Location: right  knee Pain Descriptors / Indicators: Aching, Discomfort Pain Intervention(s): Monitored during session, Repositioned, Ice applied    Home Living                          Prior Function            PT Goals (current goals can now be found in the care plan section) Acute Rehab PT Goals  Patient Stated Goal: go home Potential to Achieve Goals: Good Progress towards PT goals: Progressing toward goals    Frequency    7X/week      PT Plan      Co-evaluation              AM-PAC PT 6 Clicks Mobility   Outcome Measure  Help needed turning from your back to your side while in a flat bed without using bedrails?: A Little Help needed moving from lying on your back to sitting on the side of a flat bed without using bedrails?: A Little Help needed moving to and from a bed to a chair (including a wheelchair)?: A Little Help needed standing up from a chair using your arms (e.g., wheelchair or bedside chair)?: A Little Help needed to walk in hospital room?: A Little Help  needed climbing 3-5 steps with a railing? : A Little 6 Click Score: 18    End of Session Equipment Utilized During Treatment: Gait belt Activity Tolerance: Patient tolerated treatment well Patient left: with call bell/phone within reach;with family/visitor present;in chair Nurse Communication: Mobility status;Other (comment) (informed nursing that patient is ready for d/c and dressed for d/c home.) PT Visit Diagnosis: Unsteadiness on feet (R26.81);Difficulty in walking, not elsewhere classified (R26.2);Pain Pain - Right/Left: Right Pain - part of body: Knee     Time: 1226-1251 PT Time Calculation (min) (ACUTE ONLY): 25 min  Charges:    $Gait Training: 8-22 mins $Therapeutic Exercise: 8-22 mins PT General Charges $$ ACUTE PT VISIT: 1 Visit                     Toya HAMS , PTA Acute Rehabilitation Services Office [phone]    Rickardo Brinegar JINNY Gosling 11/21/2023, 1:01 PM

## 2023-11-22 ENCOUNTER — Encounter (HOSPITAL_COMMUNITY): Payer: Self-pay | Admitting: Orthopedic Surgery

## 2023-11-23 ENCOUNTER — Ambulatory Visit: Payer: Medicare Other | Admitting: Physician Assistant

## 2023-11-23 ENCOUNTER — Other Ambulatory Visit: Payer: Medicare Other

## 2023-11-23 DIAGNOSIS — N1831 Chronic kidney disease, stage 3a: Secondary | ICD-10-CM | POA: Diagnosis not present

## 2023-11-23 DIAGNOSIS — I10 Essential (primary) hypertension: Secondary | ICD-10-CM | POA: Diagnosis not present

## 2023-11-24 DIAGNOSIS — Z96651 Presence of right artificial knee joint: Secondary | ICD-10-CM | POA: Diagnosis not present

## 2023-11-24 DIAGNOSIS — Z7409 Other reduced mobility: Secondary | ICD-10-CM | POA: Diagnosis not present

## 2023-11-24 DIAGNOSIS — M25561 Pain in right knee: Secondary | ICD-10-CM | POA: Diagnosis not present

## 2023-11-24 DIAGNOSIS — R262 Difficulty in walking, not elsewhere classified: Secondary | ICD-10-CM | POA: Diagnosis not present

## 2023-11-25 ENCOUNTER — Inpatient Hospital Stay

## 2023-11-25 ENCOUNTER — Inpatient Hospital Stay: Admitting: Physician Assistant

## 2023-11-25 DIAGNOSIS — N1831 Chronic kidney disease, stage 3a: Secondary | ICD-10-CM | POA: Diagnosis not present

## 2023-11-25 DIAGNOSIS — I10 Essential (primary) hypertension: Secondary | ICD-10-CM | POA: Diagnosis not present

## 2023-11-26 DIAGNOSIS — R262 Difficulty in walking, not elsewhere classified: Secondary | ICD-10-CM | POA: Diagnosis not present

## 2023-11-26 DIAGNOSIS — M25561 Pain in right knee: Secondary | ICD-10-CM | POA: Diagnosis not present

## 2023-11-26 DIAGNOSIS — Z7409 Other reduced mobility: Secondary | ICD-10-CM | POA: Diagnosis not present

## 2023-11-26 DIAGNOSIS — Z96651 Presence of right artificial knee joint: Secondary | ICD-10-CM | POA: Diagnosis not present

## 2023-11-29 DIAGNOSIS — Z96651 Presence of right artificial knee joint: Secondary | ICD-10-CM | POA: Diagnosis not present

## 2023-11-29 DIAGNOSIS — M25561 Pain in right knee: Secondary | ICD-10-CM | POA: Diagnosis not present

## 2023-11-29 DIAGNOSIS — Z7409 Other reduced mobility: Secondary | ICD-10-CM | POA: Diagnosis not present

## 2023-11-29 DIAGNOSIS — R262 Difficulty in walking, not elsewhere classified: Secondary | ICD-10-CM | POA: Diagnosis not present

## 2023-11-30 DIAGNOSIS — M1711 Unilateral primary osteoarthritis, right knee: Secondary | ICD-10-CM | POA: Diagnosis not present

## 2023-11-30 DIAGNOSIS — I10 Essential (primary) hypertension: Secondary | ICD-10-CM | POA: Diagnosis not present

## 2023-11-30 DIAGNOSIS — D72819 Decreased white blood cell count, unspecified: Secondary | ICD-10-CM | POA: Diagnosis not present

## 2023-12-01 DIAGNOSIS — Z4789 Encounter for other orthopedic aftercare: Secondary | ICD-10-CM | POA: Diagnosis not present

## 2023-12-01 DIAGNOSIS — M25561 Pain in right knee: Secondary | ICD-10-CM | POA: Diagnosis not present

## 2023-12-01 DIAGNOSIS — Z96651 Presence of right artificial knee joint: Secondary | ICD-10-CM | POA: Diagnosis not present

## 2023-12-01 DIAGNOSIS — R262 Difficulty in walking, not elsewhere classified: Secondary | ICD-10-CM | POA: Diagnosis not present

## 2023-12-01 DIAGNOSIS — Z7409 Other reduced mobility: Secondary | ICD-10-CM | POA: Diagnosis not present

## 2023-12-03 DIAGNOSIS — R262 Difficulty in walking, not elsewhere classified: Secondary | ICD-10-CM | POA: Diagnosis not present

## 2023-12-03 DIAGNOSIS — M25561 Pain in right knee: Secondary | ICD-10-CM | POA: Diagnosis not present

## 2023-12-03 DIAGNOSIS — Z7409 Other reduced mobility: Secondary | ICD-10-CM | POA: Diagnosis not present

## 2023-12-03 DIAGNOSIS — Z96651 Presence of right artificial knee joint: Secondary | ICD-10-CM | POA: Diagnosis not present

## 2023-12-07 DIAGNOSIS — Z96651 Presence of right artificial knee joint: Secondary | ICD-10-CM | POA: Diagnosis not present

## 2023-12-07 DIAGNOSIS — Z7409 Other reduced mobility: Secondary | ICD-10-CM | POA: Diagnosis not present

## 2023-12-07 DIAGNOSIS — R262 Difficulty in walking, not elsewhere classified: Secondary | ICD-10-CM | POA: Diagnosis not present

## 2023-12-10 DIAGNOSIS — R262 Difficulty in walking, not elsewhere classified: Secondary | ICD-10-CM | POA: Diagnosis not present

## 2023-12-10 DIAGNOSIS — Z96651 Presence of right artificial knee joint: Secondary | ICD-10-CM | POA: Diagnosis not present

## 2023-12-10 DIAGNOSIS — M25561 Pain in right knee: Secondary | ICD-10-CM | POA: Diagnosis not present

## 2023-12-10 DIAGNOSIS — Z7409 Other reduced mobility: Secondary | ICD-10-CM | POA: Diagnosis not present

## 2023-12-15 DIAGNOSIS — Z96651 Presence of right artificial knee joint: Secondary | ICD-10-CM | POA: Diagnosis not present

## 2023-12-15 DIAGNOSIS — R262 Difficulty in walking, not elsewhere classified: Secondary | ICD-10-CM | POA: Diagnosis not present

## 2023-12-15 DIAGNOSIS — M25561 Pain in right knee: Secondary | ICD-10-CM | POA: Diagnosis not present

## 2023-12-15 DIAGNOSIS — Z7409 Other reduced mobility: Secondary | ICD-10-CM | POA: Diagnosis not present

## 2023-12-17 DIAGNOSIS — Z96651 Presence of right artificial knee joint: Secondary | ICD-10-CM | POA: Diagnosis not present

## 2023-12-17 DIAGNOSIS — M25561 Pain in right knee: Secondary | ICD-10-CM | POA: Diagnosis not present

## 2023-12-17 DIAGNOSIS — R262 Difficulty in walking, not elsewhere classified: Secondary | ICD-10-CM | POA: Diagnosis not present

## 2023-12-17 DIAGNOSIS — Z7409 Other reduced mobility: Secondary | ICD-10-CM | POA: Diagnosis not present

## 2023-12-21 DIAGNOSIS — Z96651 Presence of right artificial knee joint: Secondary | ICD-10-CM | POA: Diagnosis not present

## 2023-12-21 DIAGNOSIS — R262 Difficulty in walking, not elsewhere classified: Secondary | ICD-10-CM | POA: Diagnosis not present

## 2023-12-21 DIAGNOSIS — Z7409 Other reduced mobility: Secondary | ICD-10-CM | POA: Diagnosis not present

## 2023-12-21 DIAGNOSIS — M25561 Pain in right knee: Secondary | ICD-10-CM | POA: Diagnosis not present

## 2023-12-22 DIAGNOSIS — H0279 Other degenerative disorders of eyelid and periocular area: Secondary | ICD-10-CM | POA: Diagnosis not present

## 2023-12-22 DIAGNOSIS — H0234 Blepharochalasis left upper eyelid: Secondary | ICD-10-CM | POA: Diagnosis not present

## 2023-12-22 DIAGNOSIS — H0231 Blepharochalasis right upper eyelid: Secondary | ICD-10-CM | POA: Diagnosis not present

## 2023-12-24 DIAGNOSIS — M79671 Pain in right foot: Secondary | ICD-10-CM | POA: Diagnosis not present

## 2023-12-24 DIAGNOSIS — N1831 Chronic kidney disease, stage 3a: Secondary | ICD-10-CM | POA: Diagnosis not present

## 2023-12-24 DIAGNOSIS — I10 Essential (primary) hypertension: Secondary | ICD-10-CM | POA: Diagnosis not present

## 2023-12-24 DIAGNOSIS — S93401A Sprain of unspecified ligament of right ankle, initial encounter: Secondary | ICD-10-CM | POA: Diagnosis not present

## 2023-12-25 DIAGNOSIS — N1831 Chronic kidney disease, stage 3a: Secondary | ICD-10-CM | POA: Diagnosis not present

## 2023-12-25 DIAGNOSIS — I10 Essential (primary) hypertension: Secondary | ICD-10-CM | POA: Diagnosis not present

## 2023-12-28 DIAGNOSIS — R262 Difficulty in walking, not elsewhere classified: Secondary | ICD-10-CM | POA: Diagnosis not present

## 2023-12-28 DIAGNOSIS — Z96651 Presence of right artificial knee joint: Secondary | ICD-10-CM | POA: Diagnosis not present

## 2023-12-28 DIAGNOSIS — M25561 Pain in right knee: Secondary | ICD-10-CM | POA: Diagnosis not present

## 2023-12-28 DIAGNOSIS — Z7409 Other reduced mobility: Secondary | ICD-10-CM | POA: Diagnosis not present

## 2023-12-31 DIAGNOSIS — M25561 Pain in right knee: Secondary | ICD-10-CM | POA: Diagnosis not present

## 2023-12-31 DIAGNOSIS — Z7409 Other reduced mobility: Secondary | ICD-10-CM | POA: Diagnosis not present

## 2023-12-31 DIAGNOSIS — R262 Difficulty in walking, not elsewhere classified: Secondary | ICD-10-CM | POA: Diagnosis not present

## 2023-12-31 DIAGNOSIS — Z96651 Presence of right artificial knee joint: Secondary | ICD-10-CM | POA: Diagnosis not present

## 2024-01-04 ENCOUNTER — Other Ambulatory Visit (HOSPITAL_COMMUNITY): Payer: Self-pay | Admitting: Otolaryngology

## 2024-01-04 ENCOUNTER — Ambulatory Visit (HOSPITAL_COMMUNITY)
Admission: RE | Admit: 2024-01-04 | Discharge: 2024-01-04 | Disposition: A | Discharge status: Home / Self Care | Source: Ambulatory Visit | Attending: Surgery | Admitting: Surgery

## 2024-01-04 DIAGNOSIS — R609 Edema, unspecified: Secondary | ICD-10-CM

## 2024-01-05 DIAGNOSIS — M25561 Pain in right knee: Secondary | ICD-10-CM | POA: Diagnosis not present

## 2024-01-05 DIAGNOSIS — R262 Difficulty in walking, not elsewhere classified: Secondary | ICD-10-CM | POA: Diagnosis not present

## 2024-01-05 DIAGNOSIS — Z96651 Presence of right artificial knee joint: Secondary | ICD-10-CM | POA: Diagnosis not present

## 2024-01-05 DIAGNOSIS — Z7409 Other reduced mobility: Secondary | ICD-10-CM | POA: Diagnosis not present

## 2024-01-07 DIAGNOSIS — Z96651 Presence of right artificial knee joint: Secondary | ICD-10-CM | POA: Diagnosis not present

## 2024-01-07 DIAGNOSIS — M25561 Pain in right knee: Secondary | ICD-10-CM | POA: Diagnosis not present

## 2024-01-07 DIAGNOSIS — R262 Difficulty in walking, not elsewhere classified: Secondary | ICD-10-CM | POA: Diagnosis not present

## 2024-01-07 DIAGNOSIS — Z7409 Other reduced mobility: Secondary | ICD-10-CM | POA: Diagnosis not present

## 2024-01-11 DIAGNOSIS — Z7409 Other reduced mobility: Secondary | ICD-10-CM | POA: Diagnosis not present

## 2024-01-11 DIAGNOSIS — Z96651 Presence of right artificial knee joint: Secondary | ICD-10-CM | POA: Diagnosis not present

## 2024-01-11 DIAGNOSIS — R262 Difficulty in walking, not elsewhere classified: Secondary | ICD-10-CM | POA: Diagnosis not present

## 2024-01-11 DIAGNOSIS — M25561 Pain in right knee: Secondary | ICD-10-CM | POA: Diagnosis not present

## 2024-01-12 DIAGNOSIS — E871 Hypo-osmolality and hyponatremia: Secondary | ICD-10-CM | POA: Diagnosis not present

## 2024-01-12 DIAGNOSIS — D649 Anemia, unspecified: Secondary | ICD-10-CM | POA: Diagnosis not present

## 2024-01-13 DIAGNOSIS — Z96651 Presence of right artificial knee joint: Secondary | ICD-10-CM | POA: Diagnosis not present

## 2024-01-13 DIAGNOSIS — M25561 Pain in right knee: Secondary | ICD-10-CM | POA: Diagnosis not present

## 2024-01-13 DIAGNOSIS — R262 Difficulty in walking, not elsewhere classified: Secondary | ICD-10-CM | POA: Diagnosis not present

## 2024-01-13 DIAGNOSIS — Z7409 Other reduced mobility: Secondary | ICD-10-CM | POA: Diagnosis not present

## 2024-01-17 DIAGNOSIS — Z7409 Other reduced mobility: Secondary | ICD-10-CM | POA: Diagnosis not present

## 2024-01-17 DIAGNOSIS — M25561 Pain in right knee: Secondary | ICD-10-CM | POA: Diagnosis not present

## 2024-01-17 DIAGNOSIS — R262 Difficulty in walking, not elsewhere classified: Secondary | ICD-10-CM | POA: Diagnosis not present

## 2024-01-17 DIAGNOSIS — Z96651 Presence of right artificial knee joint: Secondary | ICD-10-CM | POA: Diagnosis not present

## 2024-01-19 DIAGNOSIS — R262 Difficulty in walking, not elsewhere classified: Secondary | ICD-10-CM | POA: Diagnosis not present

## 2024-01-19 DIAGNOSIS — Z7409 Other reduced mobility: Secondary | ICD-10-CM | POA: Diagnosis not present

## 2024-01-19 DIAGNOSIS — Z96651 Presence of right artificial knee joint: Secondary | ICD-10-CM | POA: Diagnosis not present

## 2024-01-19 DIAGNOSIS — M25561 Pain in right knee: Secondary | ICD-10-CM | POA: Diagnosis not present

## 2024-01-23 DIAGNOSIS — N1831 Chronic kidney disease, stage 3a: Secondary | ICD-10-CM | POA: Diagnosis not present

## 2024-01-23 DIAGNOSIS — I10 Essential (primary) hypertension: Secondary | ICD-10-CM | POA: Diagnosis not present

## 2024-01-24 DIAGNOSIS — M25561 Pain in right knee: Secondary | ICD-10-CM | POA: Diagnosis not present

## 2024-01-24 DIAGNOSIS — R262 Difficulty in walking, not elsewhere classified: Secondary | ICD-10-CM | POA: Diagnosis not present

## 2024-01-24 DIAGNOSIS — Z96651 Presence of right artificial knee joint: Secondary | ICD-10-CM | POA: Diagnosis not present

## 2024-01-24 DIAGNOSIS — Z7409 Other reduced mobility: Secondary | ICD-10-CM | POA: Diagnosis not present

## 2024-01-25 DIAGNOSIS — Z23 Encounter for immunization: Secondary | ICD-10-CM | POA: Diagnosis not present

## 2024-01-27 DIAGNOSIS — Z96651 Presence of right artificial knee joint: Secondary | ICD-10-CM | POA: Diagnosis not present

## 2024-01-27 DIAGNOSIS — R262 Difficulty in walking, not elsewhere classified: Secondary | ICD-10-CM | POA: Diagnosis not present

## 2024-01-27 DIAGNOSIS — Z7409 Other reduced mobility: Secondary | ICD-10-CM | POA: Diagnosis not present

## 2024-01-27 DIAGNOSIS — M25561 Pain in right knee: Secondary | ICD-10-CM | POA: Diagnosis not present

## 2024-02-01 DIAGNOSIS — M25561 Pain in right knee: Secondary | ICD-10-CM | POA: Diagnosis not present

## 2024-02-01 DIAGNOSIS — Z7409 Other reduced mobility: Secondary | ICD-10-CM | POA: Diagnosis not present

## 2024-02-01 DIAGNOSIS — Z96651 Presence of right artificial knee joint: Secondary | ICD-10-CM | POA: Diagnosis not present

## 2024-02-01 DIAGNOSIS — R262 Difficulty in walking, not elsewhere classified: Secondary | ICD-10-CM | POA: Diagnosis not present

## 2024-02-03 DIAGNOSIS — Z7409 Other reduced mobility: Secondary | ICD-10-CM | POA: Diagnosis not present

## 2024-02-03 DIAGNOSIS — R262 Difficulty in walking, not elsewhere classified: Secondary | ICD-10-CM | POA: Diagnosis not present

## 2024-02-03 DIAGNOSIS — M25561 Pain in right knee: Secondary | ICD-10-CM | POA: Diagnosis not present

## 2024-02-03 DIAGNOSIS — Z96651 Presence of right artificial knee joint: Secondary | ICD-10-CM | POA: Diagnosis not present

## 2024-02-10 DIAGNOSIS — M25561 Pain in right knee: Secondary | ICD-10-CM | POA: Diagnosis not present

## 2024-02-10 DIAGNOSIS — Z96651 Presence of right artificial knee joint: Secondary | ICD-10-CM | POA: Diagnosis not present

## 2024-02-10 DIAGNOSIS — Z7409 Other reduced mobility: Secondary | ICD-10-CM | POA: Diagnosis not present

## 2024-02-10 DIAGNOSIS — R262 Difficulty in walking, not elsewhere classified: Secondary | ICD-10-CM | POA: Diagnosis not present

## 2024-03-15 ENCOUNTER — Other Ambulatory Visit (HOSPITAL_COMMUNITY): Payer: Self-pay | Admitting: Physician Assistant

## 2024-03-15 DIAGNOSIS — Z1231 Encounter for screening mammogram for malignant neoplasm of breast: Secondary | ICD-10-CM

## 2024-04-03 ENCOUNTER — Inpatient Hospital Stay (HOSPITAL_COMMUNITY): Admission: RE | Admit: 2024-04-03
